# Patient Record
Sex: Female | Born: 1940
Health system: Southern US, Community
[De-identification: ages and names within clinical notes are randomized; demographics above are authoritative.]

## PROBLEM LIST (undated history)

## (undated) DIAGNOSIS — R7303 Prediabetes: Secondary | ICD-10-CM

## (undated) DIAGNOSIS — K5790 Diverticulosis of intestine, part unspecified, without perforation or abscess without bleeding: Secondary | ICD-10-CM

## (undated) DIAGNOSIS — E039 Hypothyroidism, unspecified: Secondary | ICD-10-CM

## (undated) DIAGNOSIS — M810 Age-related osteoporosis without current pathological fracture: Secondary | ICD-10-CM

## (undated) DIAGNOSIS — E119 Type 2 diabetes mellitus without complications: Secondary | ICD-10-CM

## (undated) DIAGNOSIS — M1990 Unspecified osteoarthritis, unspecified site: Secondary | ICD-10-CM

## (undated) DIAGNOSIS — I7300 Raynaud's syndrome without gangrene: Secondary | ICD-10-CM

## (undated) DIAGNOSIS — L940 Localized scleroderma [morphea]: Secondary | ICD-10-CM

## (undated) DIAGNOSIS — B029 Zoster without complications: Secondary | ICD-10-CM

## (undated) DIAGNOSIS — M722 Plantar fascial fibromatosis: Secondary | ICD-10-CM

## (undated) DIAGNOSIS — F419 Anxiety disorder, unspecified: Secondary | ICD-10-CM

## (undated) DIAGNOSIS — M940 Chondrocostal junction syndrome [Tietze]: Secondary | ICD-10-CM

## (undated) DIAGNOSIS — M358 Other specified systemic involvement of connective tissue: Secondary | ICD-10-CM

## (undated) DIAGNOSIS — K743 Primary biliary cirrhosis: Secondary | ICD-10-CM

## (undated) DIAGNOSIS — E079 Disorder of thyroid, unspecified: Secondary | ICD-10-CM

## (undated) DIAGNOSIS — K8020 Calculus of gallbladder without cholecystitis without obstruction: Secondary | ICD-10-CM

## (undated) DIAGNOSIS — I10 Essential (primary) hypertension: Secondary | ICD-10-CM

## (undated) DIAGNOSIS — E7800 Pure hypercholesterolemia, unspecified: Secondary | ICD-10-CM

## (undated) DIAGNOSIS — M8580 Other specified disorders of bone density and structure, unspecified site: Secondary | ICD-10-CM

## (undated) DIAGNOSIS — B009 Herpesviral infection, unspecified: Secondary | ICD-10-CM

## (undated) DIAGNOSIS — C801 Malignant (primary) neoplasm, unspecified: Secondary | ICD-10-CM

## (undated) HISTORY — DX: Pure hypercholesterolemia, unspecified: E78.00

## (undated) HISTORY — DX: Calculus of gallbladder without cholecystitis without obstruction: K80.20

## (undated) HISTORY — DX: Plantar fascial fibromatosis: M72.2

## (undated) HISTORY — DX: Chondrocostal junction syndrome (tietze): M94.0

## (undated) HISTORY — PX: CATARACT EXTRACTION: SUR2

## (undated) HISTORY — DX: Herpesviral infection, unspecified: B00.9

## (undated) HISTORY — DX: Other specified disorders of bone density and structure, unspecified site: M85.80

## (undated) HISTORY — DX: Essential (primary) hypertension: I10

## (undated) HISTORY — DX: Zoster without complications: B02.9

## (undated) HISTORY — DX: Type 2 diabetes mellitus without complications: E11.9

## (undated) HISTORY — DX: Disorder of thyroid, unspecified: E07.9

## (undated) HISTORY — DX: Age-related osteoporosis without current pathological fracture: M81.0

## (undated) HISTORY — DX: Localized scleroderma (morphea): L94.0

## (undated) HISTORY — DX: Other specified systemic involvement of connective tissue: M35.8

## (undated) HISTORY — PX: ABDOMINAL HYSTERECTOMY: SHX81

## (undated) HISTORY — DX: Diverticulosis of intestine, part unspecified, without perforation or abscess without bleeding: K57.90

## (undated) HISTORY — DX: Hypothyroidism, unspecified: E03.9

## (undated) HISTORY — DX: Malignant (primary) neoplasm, unspecified: C80.1

## (undated) HISTORY — DX: Anxiety disorder, unspecified: F41.9

## (undated) HISTORY — PX: HEMORROIDECTOMY: SUR656

## (undated) HISTORY — DX: Primary biliary cirrhosis: K74.3

---

## 1997-08-28 ENCOUNTER — Other Ambulatory Visit: Admission: RE | Admit: 1997-08-28 | Discharge: 1997-08-28 | Payer: Self-pay | Admitting: Family Medicine

## 1997-10-30 ENCOUNTER — Other Ambulatory Visit: Admission: RE | Admit: 1997-10-30 | Discharge: 1997-10-30 | Payer: Self-pay | Admitting: Gynecology

## 1998-10-30 ENCOUNTER — Other Ambulatory Visit: Admission: RE | Admit: 1998-10-30 | Discharge: 1998-10-30 | Payer: Self-pay | Admitting: Gynecology

## 1999-11-01 ENCOUNTER — Other Ambulatory Visit: Admission: RE | Admit: 1999-11-01 | Discharge: 1999-11-01 | Payer: Self-pay | Admitting: Gynecology

## 2000-11-04 ENCOUNTER — Other Ambulatory Visit: Admission: RE | Admit: 2000-11-04 | Discharge: 2000-11-04 | Payer: Self-pay | Admitting: Gynecology

## 2001-11-09 ENCOUNTER — Other Ambulatory Visit: Admission: RE | Admit: 2001-11-09 | Discharge: 2001-11-09 | Payer: Self-pay | Admitting: Gynecology

## 2002-03-09 ENCOUNTER — Encounter: Admission: RE | Admit: 2002-03-09 | Discharge: 2002-03-09 | Payer: Self-pay | Admitting: Family Medicine

## 2002-03-09 ENCOUNTER — Encounter: Payer: Self-pay | Admitting: Family Medicine

## 2002-11-15 ENCOUNTER — Other Ambulatory Visit: Admission: RE | Admit: 2002-11-15 | Discharge: 2002-11-15 | Payer: Self-pay | Admitting: Gynecology

## 2003-11-16 ENCOUNTER — Other Ambulatory Visit: Admission: RE | Admit: 2003-11-16 | Discharge: 2003-11-16 | Payer: Self-pay | Admitting: Gynecology

## 2004-02-26 ENCOUNTER — Encounter: Admission: RE | Admit: 2004-02-26 | Discharge: 2004-04-10 | Payer: Self-pay | Admitting: Neurology

## 2004-06-22 ENCOUNTER — Emergency Department (HOSPITAL_COMMUNITY): Admission: EM | Admit: 2004-06-22 | Discharge: 2004-06-22 | Payer: Self-pay | Admitting: Emergency Medicine

## 2004-07-03 ENCOUNTER — Ambulatory Visit (HOSPITAL_COMMUNITY): Admission: RE | Admit: 2004-07-03 | Discharge: 2004-07-03 | Payer: Self-pay | Admitting: Family Medicine

## 2004-07-03 ENCOUNTER — Ambulatory Visit: Payer: Self-pay | Admitting: Cardiology

## 2004-07-03 ENCOUNTER — Encounter: Payer: Self-pay | Admitting: Cardiology

## 2004-11-26 ENCOUNTER — Other Ambulatory Visit: Admission: RE | Admit: 2004-11-26 | Discharge: 2004-11-26 | Payer: Self-pay | Admitting: Gynecology

## 2005-12-08 ENCOUNTER — Other Ambulatory Visit: Admission: RE | Admit: 2005-12-08 | Discharge: 2005-12-08 | Payer: Self-pay | Admitting: Gynecology

## 2006-05-19 DIAGNOSIS — C801 Malignant (primary) neoplasm, unspecified: Secondary | ICD-10-CM

## 2006-05-19 HISTORY — DX: Malignant (primary) neoplasm, unspecified: C80.1

## 2006-10-07 ENCOUNTER — Encounter: Admission: RE | Admit: 2006-10-07 | Discharge: 2006-10-07 | Payer: Self-pay | Admitting: Family Medicine

## 2006-12-22 ENCOUNTER — Other Ambulatory Visit: Admission: RE | Admit: 2006-12-22 | Discharge: 2006-12-22 | Payer: Self-pay | Admitting: Gynecology

## 2008-03-14 IMAGING — CT CT ABDOMEN W/ CM
2 of 5 series · 17 of 46 positions shown, 19 images · IV contrast (READICAT/WATER & [ID] OMNI 300)
Comparison: None.

ABDOMEN CT WITH CONTRAST:

CLINICAL DATA: Severe abdominal pain
TECHNIQUE: Multidetector CT imaging of the abdomen and pelvis was performed
following the standard protocol during bolus administration of intravenous
contrast.

Contrast:  100 cc Omnipaque 300

[Series 2: abdomen w/ · axial · 0.70mm/px · z∈[-375,-25]mm · 14 of 80 slices shown, 16 images]
[im 5/80  soft-tissue]
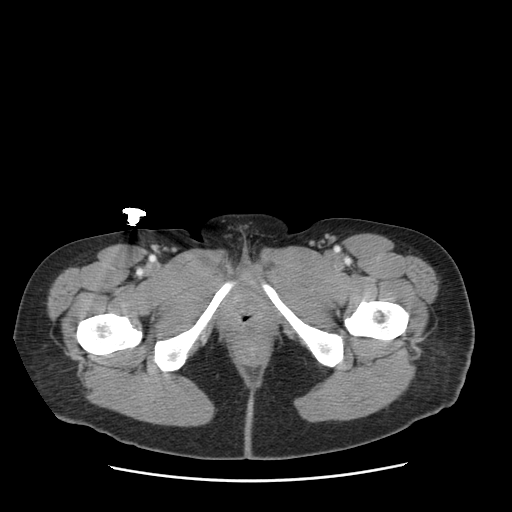
[im 5/80  bone]
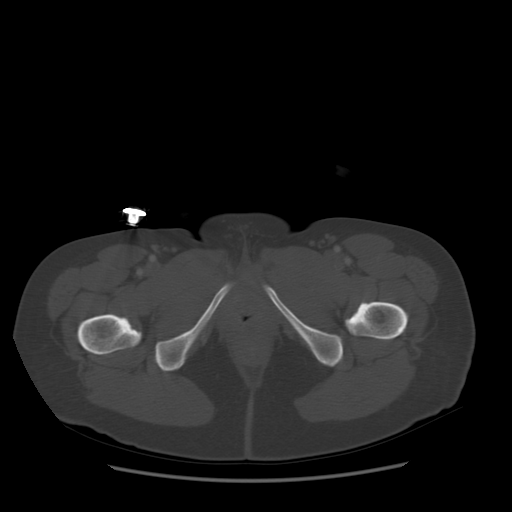
[im 9/80  soft-tissue]
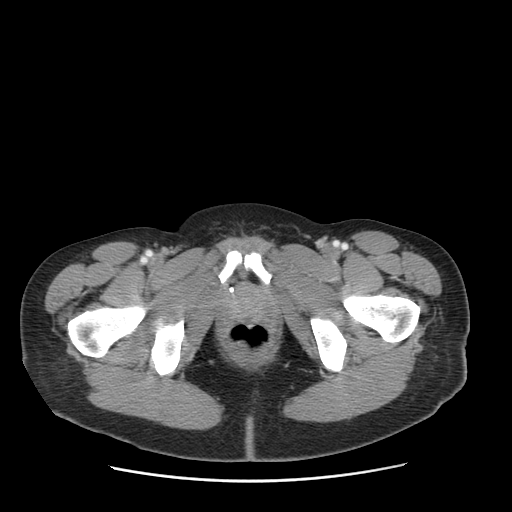
[im 17/80  soft-tissue]
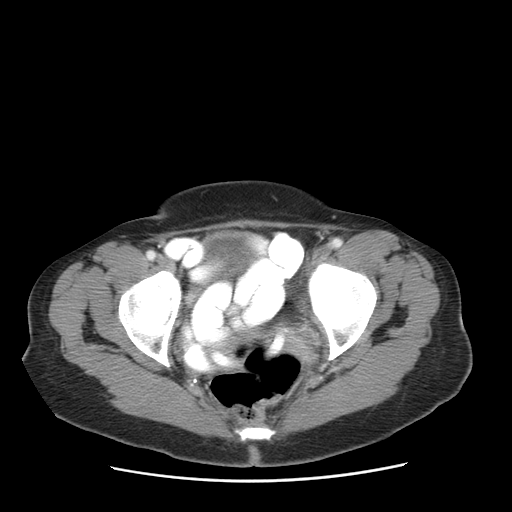
[im 21/80  soft-tissue]
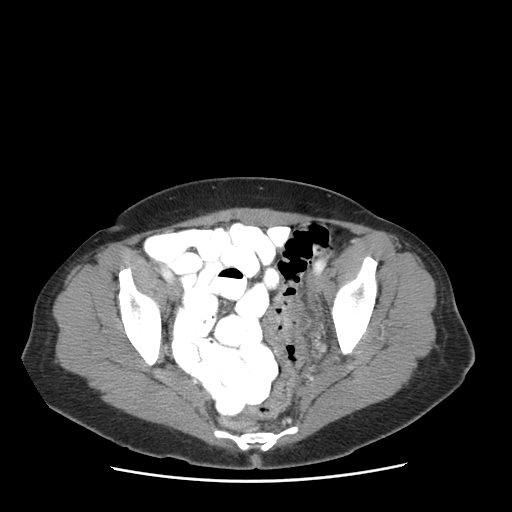
[im 25/80  soft-tissue]
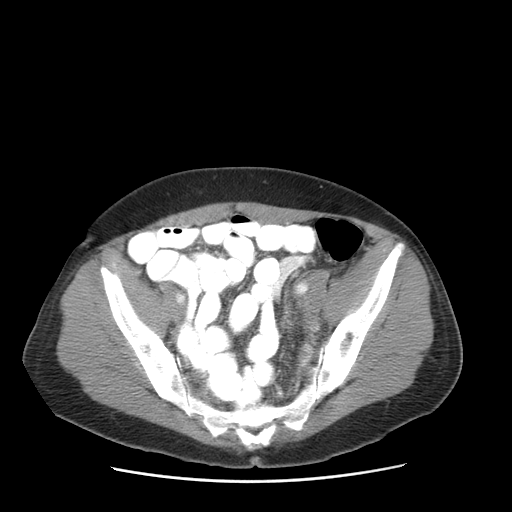
[im 34/80  soft-tissue]
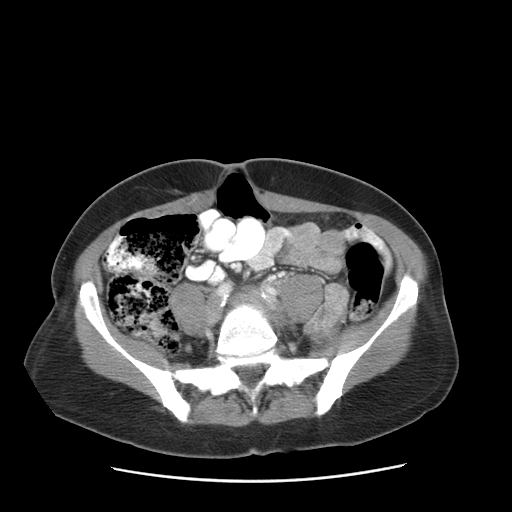
[im 38/80  soft-tissue]
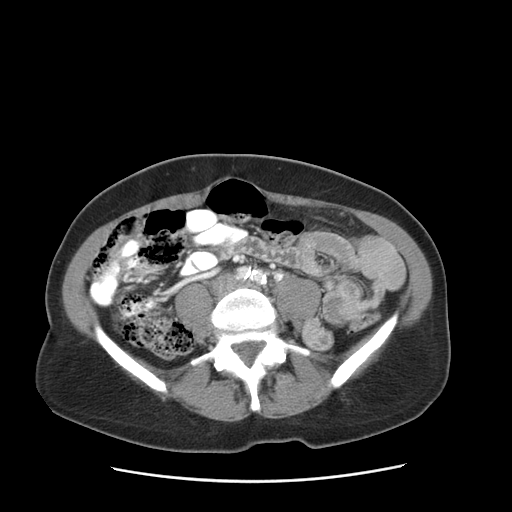
[im 42/80  soft-tissue]
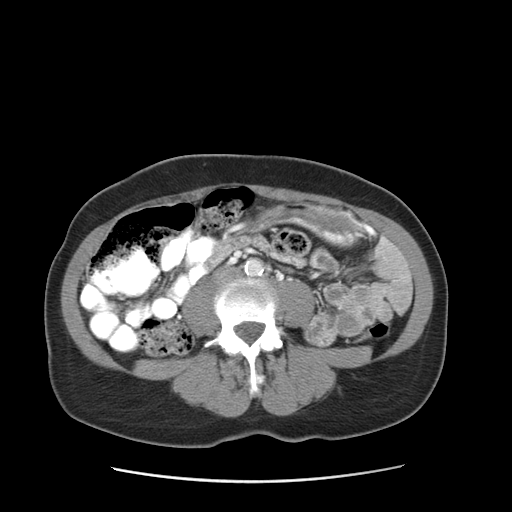
[im 46/80  soft-tissue]
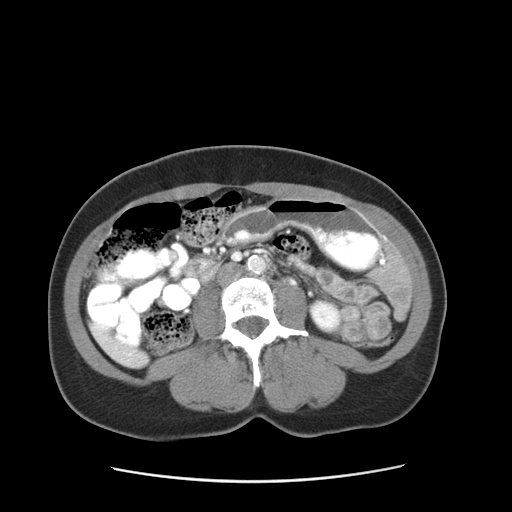
[im 46/80  bone]
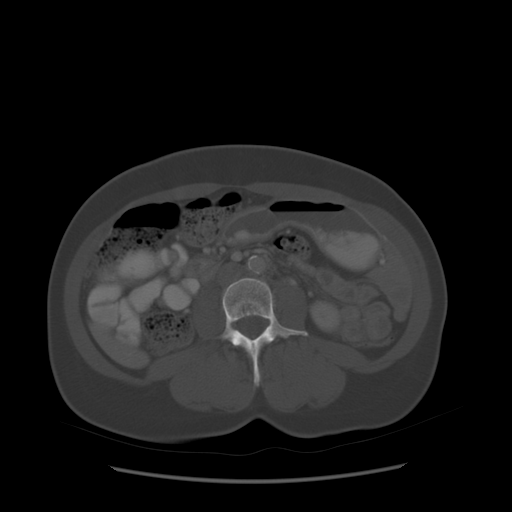
[im 55/80  soft-tissue]
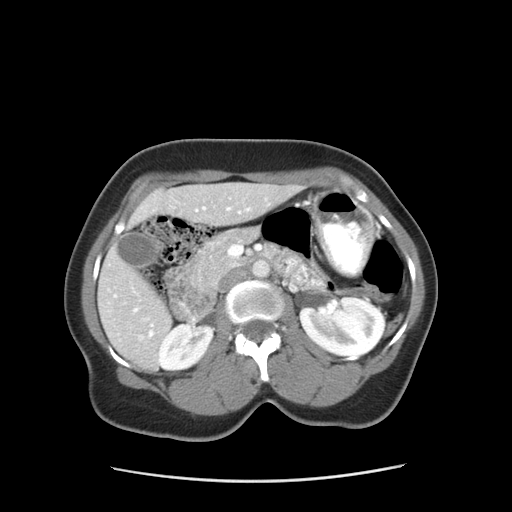
[im 59/80  soft-tissue]
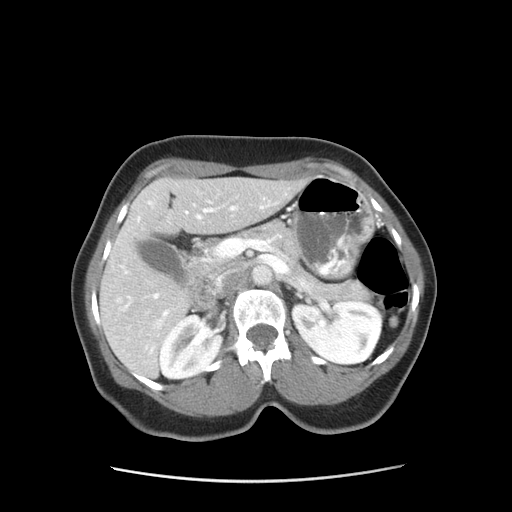
[im 63/80  soft-tissue]
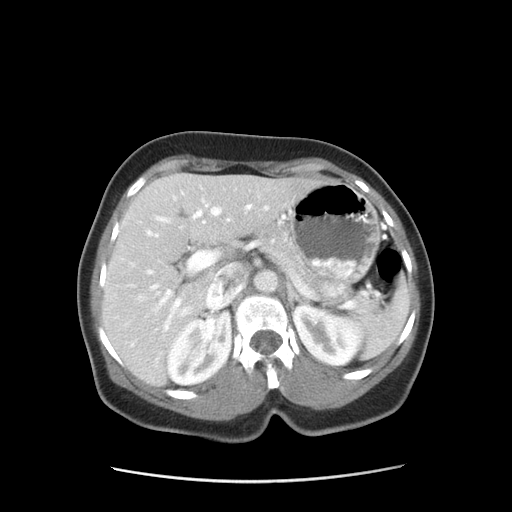
[im 71/80  soft-tissue]
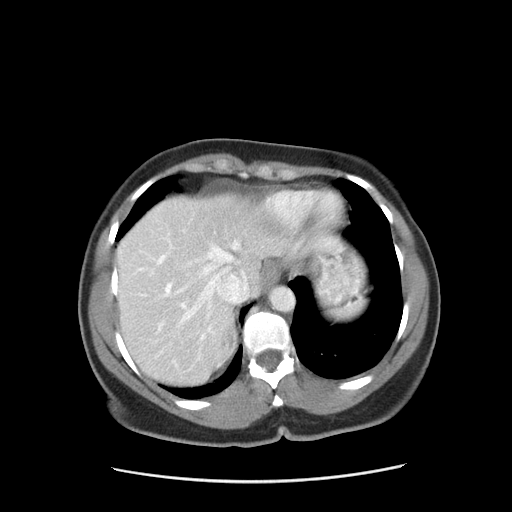
[im 75/80  soft-tissue]
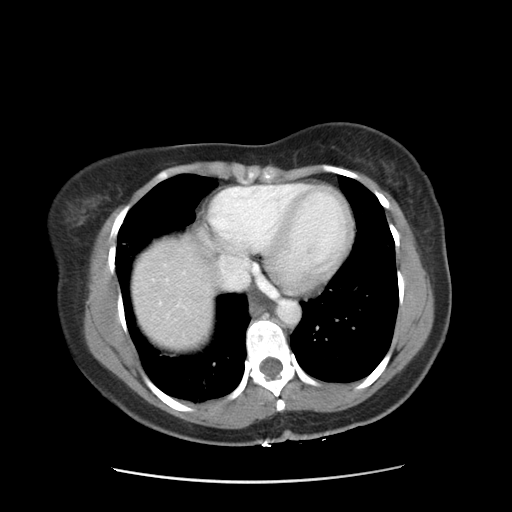

[Series 401: cor · coronal · 0.79mm/px · 3 of 85 slices shown]
[im 29/85  soft-tissue]
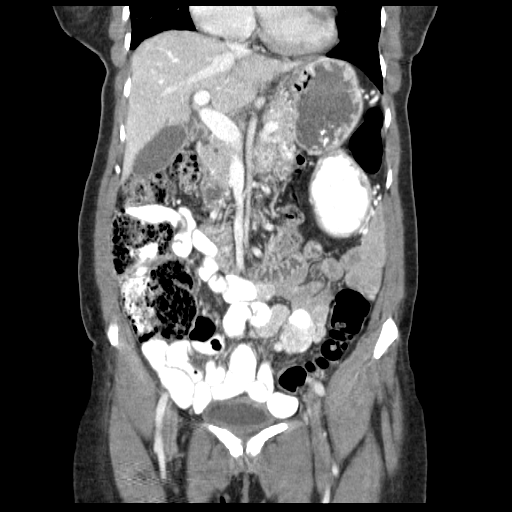
[im 38/85  soft-tissue]
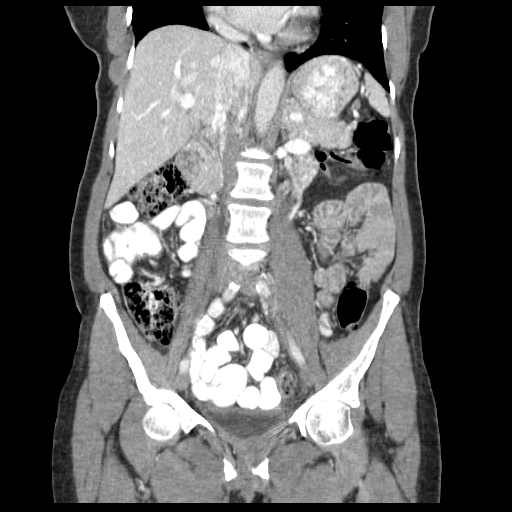
[im 47/85  soft-tissue]
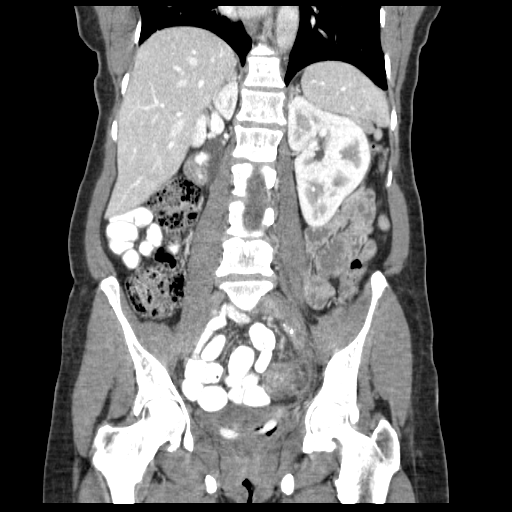

[17 of 46 positions shown; findings below may reference images not displayed]

FINDINGS: The liver, spleen, stomach, duodenum, pancreas, gallbladder, adrenal glands, and
right kidney have normal imaging features. Tiny low-density subcapsular lesion
in the left kidney is probably a tiny cyst. There is no abdominal
lymphadenopathy. Atherosclerotic calcification is seen in the aorta without
aneurysm. No intraperitoneal free fluid. Abdominal bowel loops are unremarkable.
IMPRESSION: No acute findings in the abdomen.

PELVIS CT WITH CONTRAST:
FINDINGS: No axillary lymphadenopathy. No intraperitoneal free fluid. Patient is status
post has hysterectomy. A pessary or E-ring is noted in the vagina. 

There is some wall thickening in the sigmoid colon with background left colonic
diverticulosis. The coronal and sagittal reformation is better demonstrate the
edema/inflammation around the sigmoid colon and do the axial images. 

No adnexal mass. Terminal ileum is unremarkable. The appendix is not visualized.

Bone windows are unremarkable.
IMPRESSION: Wall thickening with edema and pericolonic inflammation associated with the
sigmoid colon. Given the background diverticular change, this is most likely
related to diverticulitis without perforation or abscess. As neoplasm can
present with similar imaging features, followup evaluation is recommended.

## 2008-04-15 ENCOUNTER — Emergency Department (HOSPITAL_COMMUNITY): Admission: EM | Admit: 2008-04-15 | Discharge: 2008-04-15 | Payer: Self-pay | Admitting: Emergency Medicine

## 2011-01-14 ENCOUNTER — Ambulatory Visit: Payer: Self-pay | Admitting: Dietician

## 2012-05-19 HISTORY — PX: HEMORRHOID SURGERY: SHX153

## 2012-12-16 HISTORY — PX: COLONOSCOPY: SHX174

## 2013-04-05 ENCOUNTER — Ambulatory Visit (INDEPENDENT_AMBULATORY_CARE_PROVIDER_SITE_OTHER): Payer: Medicare PPO | Admitting: Gynecology

## 2013-04-05 ENCOUNTER — Encounter: Payer: Self-pay | Admitting: Gynecology

## 2013-04-05 ENCOUNTER — Other Ambulatory Visit (HOSPITAL_COMMUNITY)
Admission: RE | Admit: 2013-04-05 | Discharge: 2013-04-05 | Disposition: A | Discharge status: Home / Self Care | Payer: Medicare PPO | Source: Ambulatory Visit | Attending: Gynecology | Admitting: Gynecology

## 2013-04-05 VITALS — BP 132/88 | Ht 61.0 in | Wt 111.0 lb

## 2013-04-05 DIAGNOSIS — Z1151 Encounter for screening for human papillomavirus (HPV): Secondary | ICD-10-CM | POA: Insufficient documentation

## 2013-04-05 DIAGNOSIS — Z1272 Encounter for screening for malignant neoplasm of vagina: Secondary | ICD-10-CM

## 2013-04-05 DIAGNOSIS — M8580 Other specified disorders of bone density and structure, unspecified site: Secondary | ICD-10-CM

## 2013-04-05 DIAGNOSIS — M899 Disorder of bone, unspecified: Secondary | ICD-10-CM

## 2013-04-05 DIAGNOSIS — Z124 Encounter for screening for malignant neoplasm of cervix: Secondary | ICD-10-CM | POA: Insufficient documentation

## 2013-04-05 DIAGNOSIS — N952 Postmenopausal atrophic vaginitis: Secondary | ICD-10-CM | POA: Insufficient documentation

## 2013-04-05 DIAGNOSIS — D071 Carcinoma in situ of vulva: Secondary | ICD-10-CM | POA: Insufficient documentation

## 2013-04-05 MED ORDER — ESTROGENS, CONJUGATED 0.625 MG/GM VA CREA: TOPICAL_CREAM | VAGINAL | Status: DC

## 2013-04-05 NOTE — Progress Notes (Signed)
Linda Barber Mar 11, 1941 [id number]   History:    72 y.o. who is a new patient to our practice and was being previously followed by Dr. Nicholas Lose. Patient brought some records with her and which it was noted that she has a past history of of a total abdominal hysterectomy with bilateral salpingo-oophorectomy in 1995 for uterine leiomyoma. Patient also has a history of vulvar carcinoma in situ excised in 2008 with no residual or evidence of recurrence reported. Patient's primary care physician is Dr. Mirna Mires who has been doing patient's lab work. Patient had a colonoscopy with Dr. Susy Frizzle often 2014 and this year also she had a hemorrhoidectomy. Patient's last mammogram was in July of this year. Patient denies any prior history of abnormal Pap smears. Her last bone density study was in 2012 and which in total hip demonstrated the lowest T score as being -1.3. Patient states all her immunizations are up to date. Patient is currently on Premarin vaginal cream twice a week for vaginal atrophy.  Past medical history,surgical history, family history and social history were all reviewed and documented in the EPIC chart.  Gynecologic History No LMP recorded. Patient has had a hysterectomy. Contraception: post menopausal status Last Pap: 2013?Marland Kitchen Results were: normal Last mammogram: 2014. Results were: normal  Obstetric History OB History  Gravida Para Term Preterm AB SAB TAB Ectopic Multiple Living  2 2        2     # Outcome Date GA Lbr Len/2nd Weight Sex Delivery Anes PTL Lv  2 PAR           1 PAR                ROS: A ROS was performed and pertinent positives and negatives are included in the history.  GENERAL: No fevers or chills. HEENT: No change in vision, no earache, sore throat or sinus congestion. NECK: No pain or stiffness. CARDIOVASCULAR: No chest pain or pressure. No palpitations. PULMONARY: No shortness of breath, cough or wheeze. GASTROINTESTINAL: No abdominal pain, nausea, vomiting or  diarrhea, melena or bright red blood per rectum. GENITOURINARY: No urinary frequency, urgency, hesitancy or dysuria. MUSCULOSKELETAL: No joint or muscle pain, no back pain, no recent trauma. DERMATOLOGIC: No rash, no itching, no lesions. ENDOCRINE: No polyuria, polydipsia, no heat or cold intolerance. No recent change in weight. HEMATOLOGICAL: No anemia or easy bruising or bleeding. NEUROLOGIC: No headache, seizures, numbness, tingling or weakness. PSYCHIATRIC: No depression, no loss of interest in normal activity or change in sleep pattern.     Exam: chaperone present  BP 132/88  Ht 5\' 1"  (1.549 m)  Wt 111 lb (50.349 kg)  BMI 20.98 kg/m2  Body mass index is 20.98 kg/(m^2).  General appearance : Well developed well nourished female. No acute distress HEENT: Neck supple, trachea midline, no carotid bruits, no thyroidmegaly Lungs: Clear to auscultation, no rhonchi or wheezes, or rib retractions  Heart: Regular rate and rhythm, no murmurs or gallops Breast:Examined in sitting and supine position were symmetrical in appearance, no palpable masses or tenderness,  no skin retraction, no nipple inversion, no nipple discharge, no skin discoloration, no axillary or supraclavicular lymphadenopathy Abdomen: no palpable masses or tenderness, no rebound or guarding Extremities: no edema or skin discoloration or tenderness  Pelvic:  Bartholin, Urethra, Skene Glands: Within normal limits             Vagina: No gross lesions or discharge  Cervix: absent  Uterus  Absence  Adnexa  Without masses  or tenderness  Anus and perineum  normal   Rectovaginal  normal sphincter tone without palpated masses or tenderness             Hemoccult Colonoscopy and hemorrhoidectomy this year     Assessment/Plan:  72 y.o. female with past history in 2008 04 for carcinoma in situ completely excised with no residual. No abnormality noted today on visual inspection of the external genitalia. Because of patient's history of  vulvar carcinoma in situ a Pap smear was done today. PCP will be drawn patient blood work. Patient is not currently taking any calcium because her calcium level recently was in the upper limits of normal. She is taking vitamin D 2000 units daily and has an active lifestyle. She will schedule a bone density study here in our office in the next few weeks. Prescription refill for Premarin to apply vaginally twice a week was provided.  Note: This dictation was prepared with  Dragon/digital dictation along withSmart phrase technology. Any transcriptional errors that result from this process are unintentional.   Ok Edwards MD, 9:50 AM 04/05/2013

## 2013-04-05 NOTE — Patient Instructions (Signed)
Bone Densitometry Bone densitometry is a special X-ray that measures your bone density and can be used to help predict your risk of bone fractures. This test is used to determine bone mineral content and density to diagnose osteoporosis. Osteoporosis is the loss of bone that may cause the bone to become weak. Osteoporosis commonly occurs in women entering menopause. However, it may be found in men and in people with other diseases. PREPARATION FOR TEST No preparation necessary. WHO SHOULD BE TESTED?  All women older than 65.  Postmenopausal women (50 to 65) with risk factors for osteoporosis.  People with a previous fracture caused by normal activities.  People with a small body frame (less than 127 poundsor a body mass index [BMI] of less than 21).  People who have a parent with a hip fracture or history of osteoporosis.  People who smoke.  People who have rheumatoid arthritis.  Anyone who engages in excessive alcohol use (more than 3 drinks most days).  Women who experience early menopause. WHEN SHOULD YOU BE RETESTED? Current guidelines suggest that you should wait at least 2 years before doing a bone density test again if your first test was normal.Recent studies indicated that women with normal bone density may be able to wait a few years before needing to repeat a bone density test. You should discuss this with your caregiver.  NORMAL FINDINGS   Normal: less than standard deviation below normal (greater than -1).  Osteopenia: 1 to 2.5 standard deviations below normal (-1 to -2.5).  Osteoporosis: greater than 2.5 standard deviations below normal (less than -2.5). Test results are reported as a "T score" and a "Z score."The T score is a number that compares your bone density with the bone density of healthy, young women.The Z score is a number that compares your bone density with the scores of women who are the same age, gender, and race.  Ranges for normal findings may vary  among different laboratories and hospitals. You should always check with your doctor after having lab work or other tests done to discuss the meaning of your test results and whether your values are considered within normal limits. MEANING OF TEST  Your caregiver will go over the test results with you and discuss the importance and meaning of your results, as well as treatment options and the need for additional tests if necessary. OBTAINING THE TEST RESULTS It is your responsibility to obtain your test results. Ask the lab or department performing the test when and how you will get your results. Document Released: 05/27/2004 Document Revised: 07/28/2011 Document Reviewed: 06/19/2010 [employer] Patient Information 2014 [employer].  

## 2013-04-06 ENCOUNTER — Telehealth: Payer: Self-pay | Admitting: *Deleted

## 2013-04-06 MED ORDER — ESTROGENS, CONJUGATED 0.625 MG/GM VA CREA: TOPICAL_CREAM | VAGINAL | Status: DC

## 2013-04-06 NOTE — Telephone Encounter (Signed)
Pt called stating rx for premarin vaginal cream on OV 04/05/13. rx sent.

## 2013-06-07 ENCOUNTER — Encounter: Payer: Self-pay | Admitting: Anesthesiology

## 2013-06-07 ENCOUNTER — Ambulatory Visit (INDEPENDENT_AMBULATORY_CARE_PROVIDER_SITE_OTHER): Payer: Medicare PPO

## 2013-06-07 DIAGNOSIS — M949 Disorder of cartilage, unspecified: Secondary | ICD-10-CM

## 2013-06-07 DIAGNOSIS — M8580 Other specified disorders of bone density and structure, unspecified site: Secondary | ICD-10-CM

## 2013-06-07 DIAGNOSIS — M899 Disorder of bone, unspecified: Secondary | ICD-10-CM

## 2013-11-25 ENCOUNTER — Encounter: Payer: Self-pay | Admitting: Gynecology

## 2014-03-20 ENCOUNTER — Encounter: Payer: Self-pay | Admitting: Gynecology

## 2014-03-22 ENCOUNTER — Other Ambulatory Visit: Payer: Self-pay | Admitting: Gastroenterology

## 2014-03-22 DIAGNOSIS — R109 Unspecified abdominal pain: Secondary | ICD-10-CM

## 2014-03-22 DIAGNOSIS — R634 Abnormal weight loss: Secondary | ICD-10-CM

## 2014-03-30 ENCOUNTER — Encounter (INDEPENDENT_AMBULATORY_CARE_PROVIDER_SITE_OTHER): Payer: Self-pay

## 2014-03-30 ENCOUNTER — Ambulatory Visit
Admission: RE | Admit: 2014-03-30 | Discharge: 2014-03-30 | Disposition: A | Discharge status: Home / Self Care | Payer: Medicare PPO | Source: Ambulatory Visit | Attending: Gastroenterology | Admitting: Gastroenterology

## 2014-03-30 DIAGNOSIS — R634 Abnormal weight loss: Secondary | ICD-10-CM

## 2014-03-30 DIAGNOSIS — R109 Unspecified abdominal pain: Secondary | ICD-10-CM

## 2014-04-04 ENCOUNTER — Ambulatory Visit: Payer: Medicare PPO | Admitting: Gynecology

## 2014-04-04 ENCOUNTER — Other Ambulatory Visit (INDEPENDENT_AMBULATORY_CARE_PROVIDER_SITE_OTHER): Payer: Self-pay | Admitting: General Surgery

## 2014-04-04 NOTE — H&P (Signed)
Linda Barber 04/04/2014 12:06 PM Location: [facility] Patient #: [id number] DOB: March 04, 1941 Widowed / Language: Cleophus Molt / Race: Black or African American Female  History of Present Illness [name]. [name] MD; 04/04/2014 12:28 PM) Patient words: eval for gallstones.  The patient is a 73 year old female who presents with abdominal pain. We are asked to see the patient in consultation by Dr. Earlean Shawl to evaluate her for gallstones. The patient is a 73 year old black female who is been experiencing abdominal pain for the last year and a half. The pain occurs about every 2-3 months. The pain she has been having has been mostly lower and central abdominal pain. The pain is been associated with nausea and vomiting. She has changed some of her medications and this is had no effect on her pain and nausea. She recently underwent an ultrasound which did show stones in the gallbladder but no gallbladder wall thickening or ductal dilatation.   Other Problems Joseph Pierini, LPN; 32/20/2542 70:62 PM) Cancer Cholelithiasis Diabetes Mellitus Diverticulosis Hemorrhoids High blood pressure Hypercholesterolemia  Past Surgical History Joseph Pierini, LPN; 37/62/8315 17:61 PM) Cataract Surgery Bilateral. Hemorrhoidectomy Hysterectomy (not due to cancer) - Complete  Diagnostic Studies History Joseph Pierini, LPN; 60/73/7106 26:94 PM) Colonoscopy within last year Mammogram within last year Pap Smear 1-5 years ago  Allergies Joseph Pierini, LPN; 85/46/2703 50:09 PM) Codeine Phosphate *ANALGESICS - OPIOID*  Medication History Joseph Pierini, LPN; 38/18/2993 71:69 PM) ClonazePAM (Oral) Specific dose unknown - Active. Accu-Chek Aviva Plus (In Vitro) Specific dose unknown - Active. Carvedilol (Oral) Specific dose unknown - Active. Hyoscyamine Sulfate (Oral) Specific dose unknown - Active. Losartan Potassium (Oral) Specific dose unknown - Active. MetroNIDAZOLE (Oral)  Specific dose unknown - Active. Niacin ER (Antihyperlipidemic) (Oral) Specific dose unknown - Active. Premarin (Vaginal) Specific dose unknown - Active. Simvastatin (Oral) Specific dose unknown - Active. Thiothixene (Oral) Specific dose unknown - Active. Zocor (Oral) Specific dose unknown - Active. CoQ-10 & Fish Oil (120-180-50-30 Capsule, Oral) Active. Vitamin D (Oral) Specific dose unknown - Active. Multivitamin Adults 50+ (Oral) Specific dose unknown - Active. Aspirin EC (Oral) Specific dose unknown - Active.  Social History Jenny Reichmann [place], LPN; 67/89/3810 17:51 PM) Caffeine use Carbonated beverages, Coffee, Tea. No alcohol use No drug use Tobacco use Never smoker.  Family History Joseph Pierini, LPN; 02/58/5277 82:42 PM) Cerebrovascular Accident Mother. Heart Disease Mother. Hypertension Brother, Mother. Kidney Disease Brother.  Pregnancy / Birth History Joseph Pierini, LPN; 35/36/1443 15:40 PM) Age at menarche 58 years. Age of menopause 83-55 Gravida 2 Maternal age 45-30 Para 2  Review of Systems Jenny Reichmann [place] LPN; 08/67/6195 09:32 PM) General Present- Weight Loss. Not Present- Appetite Loss, Chills, Fatigue, Fever, Night Sweats and Weight Gain. Skin Not Present- Change in Wart/Mole, Dryness, Hives, Jaundice, New Lesions, Non-Healing Wounds, Rash and Ulcer. HEENT Present- Seasonal Allergies and Wears glasses/contact lenses. Not Present- Earache, Hearing Loss, Hoarseness, Nose Bleed, Oral Ulcers, Ringing in the Ears, Sinus Pain, Sore Throat, Visual Disturbances and Yellow Eyes. Respiratory Not Present- Bloody sputum, Chronic Cough, Difficulty Breathing, Snoring and Wheezing. Breast Not Present- Breast Mass, Breast Pain, Nipple Discharge and Skin Changes. Cardiovascular Present- Rapid Heart Rate. Not Present- Chest Pain, Difficulty Breathing Lying Down, Leg Cramps, Palpitations, Shortness of Breath and Swelling of Extremities. Gastrointestinal Present-  Abdominal Pain and Vomiting. Not Present- Bloating, Bloody Stool, Change in Bowel Habits, Chronic diarrhea, Constipation, Difficulty Swallowing, Excessive gas, Gets full quickly at meals, Hemorrhoids, Indigestion, Nausea and Rectal Pain. Female Genitourinary Present- Frequency. Not Present- Nocturia,  Painful Urination, Pelvic Pain and Urgency. Musculoskeletal Not Present- Back Pain, Joint Pain, Joint Stiffness, Muscle Pain, Muscle Weakness and Swelling of Extremities. Neurological Not Present- Decreased Memory, Fainting, Headaches, Numbness, Seizures, Tingling, Tremor, Trouble walking and Weakness. Psychiatric Not Present- Anxiety, Bipolar, Change in Sleep Pattern, Depression, Fearful and Frequent crying. Endocrine Present- New Diabetes. Not Present- Cold Intolerance, Excessive Hunger, Hair Changes, Heat Intolerance and Hot flashes. Hematology Not Present- Easy Bruising, Excessive bleeding, Gland problems, HIV and Persistent Infections.   Vitals Jenny Reichmann Smithey LPN; 46/96/2952 84:13 PM) 04/04/2014 12:13 PM Weight: 109 lb Height: 61in Body Surface Area: 1.46 m Body Mass Index: 20.6 kg/m Temp.: 5F(Oral)  Pulse: 70 (Regular)  Resp.: 18 (Unlabored)  BP: 134/82 (Sitting, Left Arm, Standard)    Physical Exam Eddie Dibbles S. [name] MD; 04/04/2014 12:28 PM) General Mental Status-Alert. General Appearance-Consistent with stated age. Hydration-Well hydrated. Voice-Normal.  Head and Neck Head-normocephalic, atraumatic with no lesions or palpable masses. Trachea-midline. Thyroid Gland Characteristics - normal size and consistency.  Eye Eyeball - Bilateral-Extraocular movements intact. Sclera/Conjunctiva - Bilateral-No scleral icterus.  Chest and Lung Exam Chest and lung exam reveals -quiet, even and easy respiratory effort with no use of accessory muscles and on auscultation, normal breath sounds, no adventitious sounds and normal vocal resonance. Inspection Chest  Wall - Normal. Back - normal.  Cardiovascular Cardiovascular examination reveals -normal heart sounds, regular rate and rhythm with no murmurs and normal pedal pulses bilaterally.  Abdomen Note: The abdomen is soft and nontender. There is no palpable mass. There is a well-healed lower midline scar.   Neurologic Neurologic evaluation reveals -alert and oriented x 3 with no impairment of recent or remote memory. Mental Status-Normal.  Musculoskeletal Normal Exam - Left-Upper Extremity Strength Normal and Lower Extremity Strength Normal. Normal Exam - Right-Upper Extremity Strength Normal and Lower Extremity Strength Normal.  Lymphatic Head & Neck  General Head & Neck Lymphatics: Bilateral - Description - Normal. Axillary  General Axillary Region: Bilateral - Description - Normal. Tenderness - Non Tender. Femoral & Inguinal  Generalized Femoral & Inguinal Lymphatics: Bilateral - Description - Normal. Tenderness - Non Tender.    Assessment & Plan Eddie Dibbles S. [name] MD; 04/04/2014 12:27 PM) Cholelithiasis Impression: The patient appears to have symptomatic cholelithiasis. Because of the risk of further painful episodes and possible pancreatitis think she would benefit from having her gallbladder removed. She would also like to have this done. I have discussed with her in detail the risks and benefits of the operation to remove the gallbladder as well as some of the technical aspects and she understands and wishes to proceed     Signed by Luella Cook, MD (04/04/2014 12:29 PM)

## 2014-04-25 ENCOUNTER — Other Ambulatory Visit (HOSPITAL_COMMUNITY)
Admission: RE | Admit: 2014-04-25 | Discharge: 2014-04-25 | Disposition: A | Discharge status: Home / Self Care | Payer: Medicare PPO | Source: Ambulatory Visit | Attending: Gynecology | Admitting: Gynecology

## 2014-04-25 ENCOUNTER — Ambulatory Visit (INDEPENDENT_AMBULATORY_CARE_PROVIDER_SITE_OTHER): Payer: Medicare PPO | Admitting: Gynecology

## 2014-04-25 ENCOUNTER — Encounter: Payer: Self-pay | Admitting: Gynecology

## 2014-04-25 ENCOUNTER — Encounter: Payer: Medicare PPO | Admitting: Gynecology

## 2014-04-25 VITALS — BP 134/70 | Ht 60.75 in | Wt 106.0 lb

## 2014-04-25 DIAGNOSIS — M8580 Other specified disorders of bone density and structure, unspecified site: Secondary | ICD-10-CM

## 2014-04-25 DIAGNOSIS — Z124 Encounter for screening for malignant neoplasm of cervix: Secondary | ICD-10-CM | POA: Diagnosis present

## 2014-04-25 DIAGNOSIS — D071 Carcinoma in situ of vulva: Secondary | ICD-10-CM

## 2014-04-25 DIAGNOSIS — Z1272 Encounter for screening for malignant neoplasm of vagina: Secondary | ICD-10-CM

## 2014-04-25 DIAGNOSIS — N952 Postmenopausal atrophic vaginitis: Secondary | ICD-10-CM

## 2014-04-25 MED ORDER — NONFORMULARY OR COMPOUNDED ITEM: Status: DC

## 2014-04-25 NOTE — Addendum Note (Signed)
Addended by: Thurnell Garbe A on: 04/25/2014 12:02 PM   Modules accepted: Orders

## 2014-04-25 NOTE — Progress Notes (Signed)
Linda Barber 24-Jun-1940 [id number]   History:    73 y.o.  for GYN exam. Patient also concerned about these lesion that she noted on her right buttock which comes and goes. Patient was seen last year for the first time as a new patient. Her previous provider Dr. Ubaldo Glassing had retired. She had brought some records with her and which it was noted that she has a past history of of a total abdominal hysterectomy with bilateral salpingo-oophorectomy in 1995 for uterine leiomyoma. Patient also has a history of vulvar carcinoma in situ excised in 2008 with no residual or evidence of recurrence reported. Patient's primary care physician is Dr. Iona Beard who has been doing patient's lab work. Patient had a colonoscopy in 2014 which was reported to be normal. Patient also has had a history of hemorrhoidectomy. Patient stated that many years ago she had been on Actonel for a few months. Patient had a bone density study done here in our office in January of this year and although a different manufacturer was utilized by her previous provider where she had her other scan on the T scores were compared. Her lowest T score was -1.1 at the right femoral neck and she had a normal Frax analysis. Her AP T score was -2.2. We compare with 2012 no significant change based on T score only. Patient with no past history of any abnormal Pap smears. Patient states that her flu vaccine as well as shingles vaccine are up-to-date.  Past medical history,surgical history, family history and social history were all reviewed and documented in the EPIC chart.  Gynecologic History No LMP recorded. Patient has had a hysterectomy. Contraception: post menopausal status Last Pap: 2014. Results were: normal Last mammogram: 2015. Results were: normal  Obstetric History OB History  Gravida Para Term Preterm AB SAB TAB Ectopic Multiple Living  2 2        2     # Outcome Date GA Lbr Len/2nd Weight Sex Delivery Anes PTL Lv  2 Para           1  Para                ROS: A ROS was performed and pertinent positives and negatives are included in the history.  GENERAL: No fevers or chills. HEENT: No change in vision, no earache, sore throat or sinus congestion. NECK: No pain or stiffness. CARDIOVASCULAR: No chest pain or pressure. No palpitations. PULMONARY: No shortness of breath, cough or wheeze. GASTROINTESTINAL: No abdominal pain, nausea, vomiting or diarrhea, melena or bright red blood per rectum. GENITOURINARY: No urinary frequency, urgency, hesitancy or dysuria. MUSCULOSKELETAL: No joint or muscle pain, no back pain, no recent trauma. DERMATOLOGIC: No rash, no itching, no lesions. ENDOCRINE: No polyuria, polydipsia, no heat or cold intolerance. No recent change in weight. HEMATOLOGICAL: No anemia or easy bruising or bleeding. NEUROLOGIC: No headache, seizures, numbness, tingling or weakness. PSYCHIATRIC: No depression, no loss of interest in normal activity or change in sleep pattern.     Exam: chaperone present  BP 134/70 mmHg  Ht 5' 0.75" (1.543 m)  Wt 106 lb (48.081 kg)  BMI 20.19 kg/m2  Body mass index is 20.19 kg/(m^2).  General appearance : Well developed well nourished female. No acute distress HEENT: Neck supple, trachea midline, no carotid bruits, no thyroidmegaly Lungs: Clear to auscultation, no rhonchi or wheezes, or rib retractions  Heart: Regular rate and rhythm, no murmurs or gallops Breast:Examined in sitting and supine position were  symmetrical in appearance, no palpable masses or tenderness,  no skin retraction, no nipple inversion, no nipple discharge, no skin discoloration, no axillary or supraclavicular lymphadenopathy Abdomen: no palpable masses or tenderness, no rebound or guarding Extremities: no edema or skin discoloration or tenderness  Pelvic:  Bartholin, Urethra, Skene Glands: Within normal limits             Vagina: No gross lesions or discharge, vaginal atrophy  Cervix: Absent  Uterus  absent  Adnexa  Without masses or tenderness  Anus and perineum  normal   Rectovaginal  normal sphincter tone without palpated masses or tenderness             Hemoccult PCP provides   Left buttock's scabs noted which patient had marked with a marking pen for me to see  Assessment/Plan:  73 y.o. 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 Patient with annual Pap smears of been normal. Patient with vaginal atrophy doing well with vaginal estrogen twice a week. Patient will return back to the office later in the week for biopsy of the right buttock's area which appears to be scabs. She states this comes and goes and denies any history in the past of any sexually transmitted diseases. Her PCP has been doing her blood work which she brought copies of which will be scanned in place and her records. She was reminded of the importance of calcium vitamin D and regular exercise for osteoporosis prevention we discussed importance of monthly breast exam. Pap smear was done today.   Terrance Mass MD, 11:36 AM 04/25/2014

## 2014-04-26 ENCOUNTER — Encounter (HOSPITAL_COMMUNITY)
Admission: RE | Admit: 2014-04-26 | Discharge: 2014-04-26 | Disposition: A | Discharge status: Home / Self Care | Payer: Medicare PPO | Source: Ambulatory Visit | Attending: General Surgery | Admitting: General Surgery

## 2014-04-26 ENCOUNTER — Encounter (HOSPITAL_COMMUNITY): Payer: Self-pay

## 2014-04-26 DIAGNOSIS — Z01818 Encounter for other preprocedural examination: Secondary | ICD-10-CM | POA: Insufficient documentation

## 2014-04-26 LAB — CBC WITH DIFFERENTIAL/PLATELET
BASOS PCT: 0 % (ref 0–1)
Basophils Absolute: 0 10*3/uL (ref 0.0–0.1)
EOS PCT: 1 % (ref 0–5)
Eosinophils Absolute: 0.1 10*3/uL (ref 0.0–0.7)
HEMATOCRIT: 41.5 % (ref 36.0–46.0)
HEMOGLOBIN: 13.3 g/dL (ref 12.0–15.0)
LYMPHS ABS: 2.3 10*3/uL (ref 0.7–4.0)
Lymphocytes Relative: 46 % (ref 12–46)
MCH: 27.1 pg (ref 26.0–34.0)
MCHC: 32 g/dL (ref 30.0–36.0)
MCV: 84.7 fL (ref 78.0–100.0)
MONO ABS: 0.5 10*3/uL (ref 0.1–1.0)
Monocytes Relative: 11 % (ref 3–12)
Neutro Abs: 2.2 10*3/uL (ref 1.7–7.7)
Neutrophils Relative %: 42 % — ABNORMAL LOW (ref 43–77)
Platelets: 235 10*3/uL (ref 150–400)
RBC: 4.9 MIL/uL (ref 3.87–5.11)
RDW: 13.2 % (ref 11.5–15.5)
WBC: 5.1 10*3/uL (ref 4.0–10.5)

## 2014-04-26 LAB — COMPREHENSIVE METABOLIC PANEL
ALBUMIN: 3.7 g/dL (ref 3.5–5.2)
ALT: 21 U/L (ref 0–35)
AST: 27 U/L (ref 0–37)
Alkaline Phosphatase: 64 U/L (ref 39–117)
Anion gap: 13 (ref 5–15)
BILIRUBIN TOTAL: 0.3 mg/dL (ref 0.3–1.2)
BUN: 15 mg/dL (ref 6–23)
CHLORIDE: 100 meq/L (ref 96–112)
CO2: 23 mEq/L (ref 19–32)
CREATININE: 0.66 mg/dL (ref 0.50–1.10)
Calcium: 10.2 mg/dL (ref 8.4–10.5)
GFR calc non Af Amer: 86 mL/min — ABNORMAL LOW (ref >90)
GLUCOSE: 91 mg/dL (ref 70–99)
Potassium: 4.4 mEq/L (ref 3.7–5.3)
Sodium: 136 mEq/L — ABNORMAL LOW (ref 137–147)
Total Protein: 8.1 g/dL (ref 6.0–8.3)

## 2014-04-26 LAB — CYTOLOGY - PAP

## 2014-04-26 MED ORDER — CHLORHEXIDINE GLUCONATE 4 % EX LIQD: 1.0000 "application " | Freq: Once | CUTANEOUS | Status: DC

## 2014-04-26 NOTE — Pre-Procedure Instructions (Signed)
Sandia Pfund  04/26/2014   Your procedure is scheduled on:  May 03, 2014  Report to [facility] Admitting at 8:30 AM.  Call this number if you have problems the morning of surgery: [phone]   Remember:   Do not eat food or drink liquids after midnight.   Take these medicines the morning of surgery with A SIP OF WATER: carvedilol (COREG), clonazePAM (KLONOPIN) if needed, eye drop     STOP ASPIRIN, FISH OIL, HERBAL SUPPLEMENTS, NSAIDS(IBUPROFEN) AS OF TODAY   Do not wear jewelry, make-up or nail polish.  Do not wear lotions, powders, or perfumes. You may wear deodorant.  Do not shave 48 hours prior to surgery.   Do not bring valuables to the hospital.  [facility] is not responsible for any belongings or valuables.               Contacts, dentures or bridgework may not be worn into surgery.  Leave suitcase in the car. After surgery it may be brought to your room.  For patients admitted to the hospital, discharge time is determined by your  treatment team.               Patients discharged the day of surgery will not be allowed to drive home.  Name and phone number of your driver: FAMILY/FRIEND     Please read over the following fact sheets that you were given: Pain Booklet, Coughing and Deep Breathing and Surgical Site Infection Prevention

## 2014-05-01 ENCOUNTER — Telehealth: Payer: Self-pay | Admitting: *Deleted

## 2014-05-01 ENCOUNTER — Ambulatory Visit: Payer: Medicare PPO | Admitting: Gynecology

## 2014-05-01 NOTE — Telephone Encounter (Signed)
Yes, make a same day appointment when the lesion shows up again

## 2014-05-01 NOTE — Telephone Encounter (Signed)
Pt informed, appointment will be canceled.

## 2014-05-01 NOTE — Telephone Encounter (Signed)
Pt called to follow up from [facility] on 04/25/14 was told to return per note "Patient will return back to the office later in the week for biopsy of the right buttock's area which appears to be scabs. She states this comes and goes and denies any history in the past of any sexually transmitted diseases" pt said this area is clear now, has appointment today at 2:30. Asked if okay to cancel since it has cleared? Please advise

## 2014-05-02 MED ORDER — CEFAZOLIN SODIUM-DEXTROSE 2-3 GM-% IV SOLR
2.0000 g | INTRAVENOUS | Status: AC
  Administered 2014-05-03: 2 g via INTRAVENOUS

## 2014-05-03 ENCOUNTER — Ambulatory Visit (HOSPITAL_COMMUNITY): Payer: Medicare PPO

## 2014-05-03 ENCOUNTER — Ambulatory Visit (HOSPITAL_COMMUNITY)
Admission: RE | Admit: 2014-05-03 | Discharge: 2014-05-03 | Disposition: A | Discharge status: Home / Self Care | Payer: Medicare PPO | Source: Ambulatory Visit | Attending: General Surgery | Admitting: General Surgery

## 2014-05-03 ENCOUNTER — Ambulatory Visit (HOSPITAL_COMMUNITY): Payer: Medicare PPO | Admitting: Certified Registered"

## 2014-05-03 ENCOUNTER — Encounter (HOSPITAL_COMMUNITY): Payer: Self-pay

## 2014-05-03 ENCOUNTER — Encounter (HOSPITAL_COMMUNITY)
Admission: RE | Disposition: A | Discharge status: Home / Self Care | Payer: Self-pay | Source: Ambulatory Visit | Attending: General Surgery

## 2014-05-03 DIAGNOSIS — K8010 Calculus of gallbladder with chronic cholecystitis without obstruction: Secondary | ICD-10-CM | POA: Insufficient documentation

## 2014-05-03 DIAGNOSIS — E780 Pure hypercholesterolemia: Secondary | ICD-10-CM | POA: Diagnosis not present

## 2014-05-03 DIAGNOSIS — K649 Unspecified hemorrhoids: Secondary | ICD-10-CM | POA: Diagnosis not present

## 2014-05-03 DIAGNOSIS — I10 Essential (primary) hypertension: Secondary | ICD-10-CM | POA: Insufficient documentation

## 2014-05-03 DIAGNOSIS — Z885 Allergy status to narcotic agent status: Secondary | ICD-10-CM | POA: Insufficient documentation

## 2014-05-03 DIAGNOSIS — E119 Type 2 diabetes mellitus without complications: Secondary | ICD-10-CM | POA: Insufficient documentation

## 2014-05-03 DIAGNOSIS — K8020 Calculus of gallbladder without cholecystitis without obstruction: Secondary | ICD-10-CM | POA: Diagnosis present

## 2014-05-03 DIAGNOSIS — Z419 Encounter for procedure for purposes other than remedying health state, unspecified: Secondary | ICD-10-CM

## 2014-05-03 HISTORY — DX: Type 2 diabetes mellitus without complications: E11.9

## 2014-05-03 HISTORY — PX: CHOLECYSTECTOMY: SHX55

## 2014-05-03 LAB — GLUCOSE, CAPILLARY
Glucose-Capillary: 84 mg/dL (ref 70–99)
Glucose-Capillary: 116 mg/dL — ABNORMAL HIGH (ref 70–99)

## 2014-05-03 SURGERY — LAPAROSCOPIC CHOLECYSTECTOMY WITH INTRAOPERATIVE CHOLANGIOGRAM
Anesthesia: General | Site: Abdomen

## 2014-05-03 MED ORDER — PROMETHAZINE HCL 25 MG/ML IJ SOLN
INTRAMUSCULAR | Status: AC
  Administered 2014-05-03: 6.25 mg
  Filled 2014-05-03: qty 1

## 2014-05-03 MED ORDER — PHENYLEPHRINE 40 MCG/ML (10ML) SYRINGE FOR IV PUSH (FOR BLOOD PRESSURE SUPPORT)
PREFILLED_SYRINGE | INTRAVENOUS | Status: AC
  Filled 2014-05-03: qty 10

## 2014-05-03 MED ORDER — PROMETHAZINE HCL 25 MG/ML IJ SOLN
6.2500 mg | Freq: Once | INTRAMUSCULAR | Status: AC
  Administered 2014-05-03 (×2): 6.25 mg via INTRAVENOUS

## 2014-05-03 MED ORDER — BUPIVACAINE-EPINEPHRINE 0.25% -1:200000 IJ SOLN
INTRAMUSCULAR | Status: DC | PRN
  Administered 2014-05-03: 30 mL

## 2014-05-03 MED ORDER — ROCURONIUM BROMIDE 50 MG/5ML IV SOLN
INTRAVENOUS | Status: AC
  Filled 2014-05-03: qty 1

## 2014-05-03 MED ORDER — FENTANYL CITRATE 0.05 MG/ML IJ SOLN
25.0000 ug | INTRAMUSCULAR | Status: DC | PRN
  Administered 2014-05-03: 25 ug via INTRAVENOUS

## 2014-05-03 MED ORDER — OXYCODONE HCL 5 MG PO TABS
ORAL_TABLET | ORAL | Status: AC
  Filled 2014-05-03: qty 1

## 2014-05-03 MED ORDER — 0.9 % SODIUM CHLORIDE (POUR BTL) OPTIME
TOPICAL | Status: DC | PRN
  Administered 2014-05-03: 1000 mL

## 2014-05-03 MED ORDER — OXYCODONE HCL 5 MG PO TABS
5.0000 mg | ORAL_TABLET | Freq: Once | ORAL | Status: AC | PRN
  Administered 2014-05-03: 5 mg via ORAL

## 2014-05-03 MED ORDER — FENTANYL CITRATE 0.05 MG/ML IJ SOLN
INTRAMUSCULAR | Status: DC | PRN
  Administered 2014-05-03: 100 ug via INTRAVENOUS
  Administered 2014-05-03 (×3): 50 ug via INTRAVENOUS

## 2014-05-03 MED ORDER — GLYCOPYRROLATE 0.2 MG/ML IJ SOLN
INTRAMUSCULAR | Status: DC | PRN
  Administered 2014-05-03: 0.6 mg via INTRAVENOUS

## 2014-05-03 MED ORDER — PROPOFOL 10 MG/ML IV BOLUS
INTRAVENOUS | Status: AC
  Filled 2014-05-03: qty 20

## 2014-05-03 MED ORDER — OXYCODONE HCL 5 MG/5ML PO SOLN: 5.0000 mg | Freq: Once | ORAL | Status: AC | PRN

## 2014-05-03 MED ORDER — IOHEXOL 300 MG/ML  SOLN
INTRAMUSCULAR | Status: DC | PRN
  Administered 2014-05-03: 50 mL

## 2014-05-03 MED ORDER — NEOSTIGMINE METHYLSULFATE 10 MG/10ML IV SOLN
INTRAVENOUS | Status: DC | PRN
Start: 2014-05-03 — End: 2014-05-03
  Administered 2014-05-03: 4 mg via INTRAVENOUS

## 2014-05-03 MED ORDER — LIDOCAINE HCL (CARDIAC) 20 MG/ML IV SOLN
INTRAVENOUS | Status: DC | PRN
  Administered 2014-05-03: 60 mg via INTRAVENOUS

## 2014-05-03 MED ORDER — SODIUM CHLORIDE 0.9 % IR SOLN
Status: DC | PRN
  Administered 2014-05-03: 1000 mL

## 2014-05-03 MED ORDER — FENTANYL CITRATE 0.05 MG/ML IJ SOLN
INTRAMUSCULAR | Status: AC
  Filled 2014-05-03: qty 5

## 2014-05-03 MED ORDER — ONDANSETRON HCL 4 MG/2ML IJ SOLN
INTRAMUSCULAR | Status: DC | PRN
  Administered 2014-05-03: 4 mg via INTRAVENOUS

## 2014-05-03 MED ORDER — LACTATED RINGERS IV SOLN
INTRAVENOUS | Status: DC | PRN
  Administered 2014-05-03 (×2): via INTRAVENOUS

## 2014-05-03 MED ORDER — FENTANYL CITRATE 0.05 MG/ML IJ SOLN
INTRAMUSCULAR | Status: DC
Start: 2014-05-03 — End: 2014-05-03
  Filled 2014-05-03: qty 2

## 2014-05-03 MED ORDER — CEFAZOLIN SODIUM-DEXTROSE 2-3 GM-% IV SOLR
INTRAVENOUS | Status: AC
  Filled 2014-05-03: qty 50

## 2014-05-03 MED ORDER — LACTATED RINGERS IV SOLN
INTRAVENOUS | Status: DC
  Administered 2014-05-03: 09:00:00 via INTRAVENOUS

## 2014-05-03 MED ORDER — PROPOFOL 10 MG/ML IV BOLUS
INTRAVENOUS | Status: DC | PRN
  Administered 2014-05-03: 100 mg via INTRAVENOUS

## 2014-05-03 MED ORDER — OXYCODONE-ACETAMINOPHEN 5-325 MG PO TABS: 1.0000 | ORAL_TABLET | ORAL | Status: DC | PRN

## 2014-05-03 MED ORDER — GLYCOPYRROLATE 0.2 MG/ML IJ SOLN
INTRAMUSCULAR | Status: AC
  Filled 2014-05-03: qty 3

## 2014-05-03 MED ORDER — ROCURONIUM BROMIDE 100 MG/10ML IV SOLN
INTRAVENOUS | Status: DC | PRN
  Administered 2014-05-03: 30 mg via INTRAVENOUS

## 2014-05-03 MED ORDER — NEOSTIGMINE METHYLSULFATE 10 MG/10ML IV SOLN
INTRAVENOUS | Status: AC
  Filled 2014-05-03: qty 1

## 2014-05-03 MED ORDER — SUCCINYLCHOLINE CHLORIDE 20 MG/ML IJ SOLN
INTRAMUSCULAR | Status: AC
  Filled 2014-05-03: qty 1

## 2014-05-03 MED ORDER — BUPIVACAINE-EPINEPHRINE (PF) 0.25% -1:200000 IJ SOLN
INTRAMUSCULAR | Status: AC
  Filled 2014-05-03: qty 30

## 2014-05-03 MED ORDER — SODIUM CHLORIDE 0.9 % IJ SOLN
INTRAMUSCULAR | Status: AC
  Filled 2014-05-03: qty 3

## 2014-05-03 SURGICAL SUPPLY — 34 items
APPLIER CLIP 5 13 M/L LIGAMAX5 (MISCELLANEOUS) ×3
CANISTER SUCTION 2500CC (MISCELLANEOUS) ×3 IMPLANT
CATH REDDICK CHOLANGI 4FR 50CM (CATHETERS) ×3 IMPLANT
CHLORAPREP W/TINT 26ML (MISCELLANEOUS) ×3 IMPLANT
CLIP APPLIE 5 13 M/L LIGAMAX5 (MISCELLANEOUS) ×1 IMPLANT
COVER MAYO STAND STRL (DRAPES) ×3 IMPLANT
COVER SURGICAL LIGHT HANDLE (MISCELLANEOUS) ×3 IMPLANT
DRAPE C-ARM 42X72 X-RAY (DRAPES) ×3 IMPLANT
DRAPE LAPAROSCOPIC ABDOMINAL (DRAPES) ×3 IMPLANT
ELECT REM PT RETURN 9FT ADLT (ELECTROSURGICAL) ×3
ELECTRODE REM PT RTRN 9FT ADLT (ELECTROSURGICAL) ×1 IMPLANT
GLOVE BIO SURGEON STRL SZ7.5 (GLOVE) ×6 IMPLANT
GLOVE BIOGEL PI IND STRL 7.5 (GLOVE) ×1 IMPLANT
GLOVE BIOGEL PI INDICATOR 7.5 (GLOVE) ×2
GLOVE ECLIPSE 7.5 STRL STRAW (GLOVE) ×3 IMPLANT
GOWN STRL REUS W/ TWL LRG LVL3 (GOWN DISPOSABLE) ×3 IMPLANT
GOWN STRL REUS W/TWL LRG LVL3 (GOWN DISPOSABLE) ×6
IV CATH 14GX2 1/4 (CATHETERS) ×3 IMPLANT
KIT BASIN OR (CUSTOM PROCEDURE TRAY) ×3 IMPLANT
KIT ROOM TURNOVER OR (KITS) ×3 IMPLANT
LIQUID BAND (GAUZE/BANDAGES/DRESSINGS) ×3 IMPLANT
NS IRRIG 1000ML POUR BTL (IV SOLUTION) ×3 IMPLANT
PAD ARMBOARD 7.5X6 YLW CONV (MISCELLANEOUS) ×3 IMPLANT
POUCH SPECIMEN RETRIEVAL 10MM (ENDOMECHANICALS) ×3 IMPLANT
SCISSORS LAP 5X35 DISP (ENDOMECHANICALS) ×3 IMPLANT
SET IRRIG TUBING LAPAROSCOPIC (IRRIGATION / IRRIGATOR) ×3 IMPLANT
SLEEVE ENDOPATH XCEL 5M (ENDOMECHANICALS) ×6 IMPLANT
SPECIMEN JAR SMALL (MISCELLANEOUS) ×3 IMPLANT
SUT MNCRL AB 4-0 PS2 18 (SUTURE) ×3 IMPLANT
TOWEL OR 17X26 10 PK STRL BLUE (TOWEL DISPOSABLE) ×3 IMPLANT
TRAY LAPAROSCOPIC (CUSTOM PROCEDURE TRAY) ×3 IMPLANT
TROCAR XCEL BLUNT TIP 100MML (ENDOMECHANICALS) ×3 IMPLANT
TROCAR XCEL NON-BLD 5MMX100MML (ENDOMECHANICALS) ×3 IMPLANT
TUBING INSUFFLATION (TUBING) ×3 IMPLANT

## 2014-05-03 NOTE — H&P (View-Only) (Signed)
Linda Barber 04/04/2014 12:06 PM Location: [facility] Patient #: [id number] DOB: 1941/01/17 Widowed / Language: Linda Barber / Race: Black or African American Female  History of Present Illness Linda Barber. Linda Starks MD; 04/04/2014 12:28 PM) Patient words: eval for gallstones.  The patient is a 73 year old female who presents with abdominal pain. We are asked to see the patient in consultation by Dr. Earlean Barber to evaluate her for gallstones. The patient is a 73 year old black female who is been experiencing abdominal pain for the last year and a half. The pain occurs about every 2-3 months. The pain she has been having has been mostly lower and central abdominal pain. The pain is been associated with nausea and vomiting. She has changed some of her medications and this is had no effect on her pain and nausea. She recently underwent an ultrasound which did show stones in the gallbladder but no gallbladder wall thickening or ductal dilatation.   Other Problems Linda Pierini, LPN; 42/87/6811 57:26 PM) Cancer Cholelithiasis Diabetes Mellitus Diverticulosis Hemorrhoids High blood pressure Hypercholesterolemia  Past Surgical History Linda Pierini, LPN; 20/35/5974 16:38 PM) Cataract Surgery Bilateral. Hemorrhoidectomy Hysterectomy (not due to cancer) - Complete  Diagnostic Studies History Linda Pierini, LPN; 45/36/4680 32:12 PM) Colonoscopy within last year Mammogram within last year Pap Smear 1-5 years ago  Allergies Linda Pierini, LPN; 24/82/5003 70:48 PM) Codeine Phosphate *ANALGESICS - OPIOID*  Medication History Linda Pierini, LPN; 88/91/6945 03:88 PM) ClonazePAM (Oral) Specific dose unknown - Active. Accu-Chek Aviva Plus (In Vitro) Specific dose unknown - Active. Carvedilol (Oral) Specific dose unknown - Active. Hyoscyamine Sulfate (Oral) Specific dose unknown - Active. Losartan Potassium (Oral) Specific dose unknown - Active. MetroNIDAZOLE (Oral)  Specific dose unknown - Active. Niacin ER (Antihyperlipidemic) (Oral) Specific dose unknown - Active. Premarin (Vaginal) Specific dose unknown - Active. Simvastatin (Oral) Specific dose unknown - Active. Thiothixene (Oral) Specific dose unknown - Active. Zocor (Oral) Specific dose unknown - Active. CoQ-10 & Fish Oil (120-180-50-30 Capsule, Oral) Active. Vitamin D (Oral) Specific dose unknown - Active. Multivitamin Adults 50+ (Oral) Specific dose unknown - Active. Aspirin EC (Oral) Specific dose unknown - Active.  Social History Linda Reichmann Gleason, LPN; 82/80/0349 17:91 PM) Caffeine use Carbonated beverages, Coffee, Tea. No alcohol use No drug use Tobacco use Never smoker.  Family History Linda Pierini, LPN; 50/56/9794 80:16 PM) Cerebrovascular Accident Mother. Heart Disease Mother. Hypertension Brother, Mother. Kidney Disease Brother.  Pregnancy / Birth History Linda Pierini, LPN; 55/37/4827 07:86 PM) Age at menarche 3 years. Age of menopause 84-55 Gravida 2 Maternal age 46-30 Para 2  Review of Systems Linda Reichmann Smithey LPN; 75/44/9201 00:71 PM) General Present- Weight Loss. Not Present- Appetite Loss, Chills, Fatigue, Fever, Night Sweats and Weight Gain. Skin Not Present- Change in Wart/Mole, Dryness, Hives, Jaundice, New Lesions, Non-Healing Wounds, Rash and Ulcer. HEENT Present- Seasonal Allergies and Wears glasses/contact lenses. Not Present- Earache, Hearing Loss, Hoarseness, Nose Bleed, Oral Ulcers, Ringing in the Ears, Sinus Pain, Sore Throat, Visual Disturbances and Yellow Eyes. Respiratory Not Present- Bloody sputum, Chronic Cough, Difficulty Breathing, Snoring and Wheezing. Breast Not Present- Breast Mass, Breast Pain, Nipple Discharge and Skin Changes. Cardiovascular Present- Rapid Heart Rate. Not Present- Chest Pain, Difficulty Breathing Lying Down, Leg Cramps, Palpitations, Shortness of Breath and Swelling of Extremities. Gastrointestinal Present-  Abdominal Pain and Vomiting. Not Present- Bloating, Bloody Stool, Change in Bowel Habits, Chronic diarrhea, Constipation, Difficulty Swallowing, Excessive gas, Gets full quickly at meals, Hemorrhoids, Indigestion, Nausea and Rectal Pain. Female Genitourinary Present- Frequency. Not Present- Nocturia,  Painful Urination, Pelvic Pain and Urgency. Musculoskeletal Not Present- Back Pain, Joint Pain, Joint Stiffness, Muscle Pain, Muscle Weakness and Swelling of Extremities. Neurological Not Present- Decreased Memory, Fainting, Headaches, Numbness, Seizures, Tingling, Tremor, Trouble walking and Weakness. Psychiatric Not Present- Anxiety, Bipolar, Change in Sleep Pattern, Depression, Fearful and Frequent crying. Endocrine Present- New Diabetes. Not Present- Cold Intolerance, Excessive Hunger, Hair Changes, Heat Intolerance and Hot flashes. Hematology Not Present- Easy Bruising, Excessive bleeding, Gland problems, HIV and Persistent Infections.   Vitals Linda Reichmann Smithey LPN; 13/12/6576 46:96 PM) 04/04/2014 12:13 PM Weight: 109 lb Height: 61in Body Surface Area: 1.46 m Body Mass Index: 20.6 kg/m Temp.: 70F(Oral)  Pulse: 70 (Regular)  Resp.: 18 (Unlabored)  BP: 134/82 (Sitting, Left Arm, Standard)    Physical Exam Linda Dibbles S. Linda Starks MD; 04/04/2014 12:28 PM) General Mental Status-Alert. General Appearance-Consistent with stated age. Hydration-Well hydrated. Voice-Normal.  Head and Neck Head-normocephalic, atraumatic with no lesions or palpable masses. Trachea-midline. Thyroid Gland Characteristics - normal size and consistency.  Eye Eyeball - Bilateral-Extraocular movements intact. Sclera/Conjunctiva - Bilateral-No scleral icterus.  Chest and Lung Exam Chest and lung exam reveals -quiet, even and easy respiratory effort with no use of accessory muscles and on auscultation, normal breath sounds, no adventitious sounds and normal vocal resonance. Inspection Chest  Wall - Normal. Back - normal.  Cardiovascular Cardiovascular examination reveals -normal heart sounds, regular rate and rhythm with no murmurs and normal pedal pulses bilaterally.  Abdomen Note: The abdomen is soft and nontender. There is no palpable mass. There is a well-healed lower midline scar.   Neurologic Neurologic evaluation reveals -alert and oriented x 3 with no impairment of recent or remote memory. Mental Status-Normal.  Musculoskeletal Normal Exam - Left-Upper Extremity Strength Normal and Lower Extremity Strength Normal. Normal Exam - Right-Upper Extremity Strength Normal and Lower Extremity Strength Normal.  Lymphatic Head & Neck  General Head & Neck Lymphatics: Bilateral - Description - Normal. Axillary  General Axillary Region: Bilateral - Description - Normal. Tenderness - Non Tender. Femoral & Inguinal  Generalized Femoral & Inguinal Lymphatics: Bilateral - Description - Normal. Tenderness - Non Tender.    Assessment & Plan Linda Dibbles S. Linda Starks MD; 04/04/2014 12:27 PM) Cholelithiasis Impression: The patient appears to have symptomatic cholelithiasis. Because of the risk of further painful episodes and possible pancreatitis think she would benefit from having her gallbladder removed. She would also like to have this done. I have discussed with her in detail the risks and benefits of the operation to remove the gallbladder as well as some of the technical aspects and she understands and wishes to proceed     Signed by Luella Cook, MD (04/04/2014 12:29 PM)

## 2014-05-03 NOTE — Anesthesia Postprocedure Evaluation (Signed)
  Anesthesia Post-op Note  Patient: Linda Barber  Procedure(s) Performed: Procedure(s): LAPAROSCOPIC CHOLECYSTECTOMY WITH INTRAOPERATIVE CHOLANGIOGRAM (N/A)  Patient Location: PACU  Anesthesia Type:General  Level of Consciousness: awake  Airway and Oxygen Therapy: Patient Spontanous Breathing  Post-op Pain: mild  Post-op Assessment: Post-op Vital signs reviewed, Patient's Cardiovascular Status Stable, Respiratory Function Stable, Patent Airway, No signs of Nausea or vomiting and Pain level controlled  Post-op Vital Signs: Reviewed and stable  Last Vitals:  Filed Vitals:   05/03/14 1338  BP: 139/63  Pulse: 64  Temp:   Resp: 15    Complications: No apparent anesthesia complications

## 2014-05-03 NOTE — Anesthesia Procedure Notes (Signed)
Procedure Name: Intubation Date/Time: 05/03/2014 10:30 AM Performed by: Maeola Harman Pre-anesthesia Checklist: Patient identified, Emergency Drugs available, Suction available, Patient being monitored and Timeout performed Patient Re-evaluated:Patient Re-evaluated prior to inductionOxygen Delivery Method: Circle system utilized Preoxygenation: Pre-oxygenation with 100% oxygen Intubation Type: IV induction Ventilation: Mask ventilation without difficulty Laryngoscope Size: Mac and 3 Grade View: Grade I Tube type: Oral Tube size: 7.5 mm Number of attempts: 1 Airway Equipment and Method: Stylet Placement Confirmation: ETT inserted through vocal cords under direct vision,  positive ETCO2 and breath sounds checked- equal and bilateral Secured at: 22 cm Tube secured with: Tape Dental Injury: Teeth and Oropharynx as per pre-operative assessment  Comments: Easy induction and intubation with MAC 3 blade.  Waldron Session, CRNA

## 2014-05-03 NOTE — Op Note (Signed)
05/03/2014  11:27 AM  PATIENT:  Linda Barber  73 y.o. female  PRE-OPERATIVE DIAGNOSIS:  Gallstones  POST-OPERATIVE DIAGNOSIS:  Gallstones  PROCEDURE:  Procedure(s): LAPAROSCOPIC CHOLECYSTECTOMY WITH INTRAOPERATIVE CHOLANGIOGRAM (N/A)  SURGEON:  Surgeon(s) and Role:    * Jovita Kussmaul, MD - Primary  PHYSICIAN ASSISTANT:   ASSISTANTS: Judyann Munson, RNFA   ANESTHESIA:   general  EBL:     BLOOD ADMINISTERED:none  DRAINS: none   LOCAL MEDICATIONS USED:  MARCAINE     SPECIMEN:  Source of Specimen:  gallbladder  DISPOSITION OF SPECIMEN:  PATHOLOGY  COUNTS:  YES  TOURNIQUET:  * No tourniquets in log *  DICTATION: .Dragon Dictation   Procedure: After informed consent was obtained the patient was brought to the operating room and placed in the supine position on the operating room table. After adequate induction of general anesthesia the patient's abdomen was prepped with ChloraPrep allowed to dry and draped in usual sterile manner. The area below the umbilicus was infiltrated with quarter percent  Marcaine. A small incision was made with a 15 blade knife. The incision was carried down through the subcutaneous tissue bluntly with a hemostat and [employer] retractors. The linea alba was identified. The linea alba was incised with a 15 blade knife and each side was grasped with Coker clamps. The preperitoneal space was then probed with a hemostat until the peritoneum was opened and access was gained to the abdominal cavity. A 0 Vicryl pursestring stitch was placed in the fascia surrounding the opening. A Hassan cannula was then placed through the opening and anchored in place with the previously placed Vicryl purse string stitch. The abdomen was insufflated with carbon dioxide without difficulty. A laparoscope was inserted through the [facility] cannula in the right upper quadrant was inspected. Next the epigastric region was infiltrated with % Marcaine. A small incision was made with a  15 blade knife. A 5 mm port was placed bluntly through this incision into the abdominal cavity under direct vision. Next 2 sites were chosen laterally on the right side of the abdomen for placement of 5 mm ports. Each of these areas was infiltrated with quarter percent Marcaine. Small stab incisions were made with a 15 blade knife. 5 mm ports were then placed bluntly through these incisions into the abdominal cavity under direct vision without difficulty. A blunt grasper was placed through the lateralmost 5 mm port and used to grasp the dome of the gallbladder and elevated anteriorly and superiorly. Another blunt grasper was placed through the other 5 mm port and used to retract the body and neck of the gallbladder. A dissector was placed through the epigastric port and using the electrocautery the peritoneal reflection at the gallbladder neck was opened. Blunt dissection was then carried out in this area until the gallbladder neck-cystic duct junction was readily identified and a good window was created. A single clip was placed on the gallbladder neck. A small  ductotomy was made just below the clip with laparoscopic scissors. A 14-gauge Angiocath was then placed through the anterior abdominal wall under direct vision. A Reddick cholangiogram catheter was then placed through the Angiocath and flushed. The catheter was then placed in the cystic duct and anchored in place with a clip. A cholangiogram was obtained that showed no filling defects good emptying into the duodenum an adequate length on the cystic duct. The anchoring clip and catheters were then removed from the patient. 3 clips were placed proximally on the cystic duct and the  duct was divided between the 2 sets of clips. Posterior to this the cystic artery was identified and again dissected bluntly in a circumferential manner until a good window  was created. 2 clips were placed proximally and one distally on the artery and the artery was divided between  the 2 sets of clips. Next a laparoscopic hook cautery device was used to separate the gallbladder from the liver bed. Prior to completely detaching the gallbladder from the liver bed the liver bed was inspected and several small bleeding points were coagulated with the electrocautery until the area was completely hemostatic. The gallbladder was then detached the rest of it from the liver bed without difficulty. A laparoscopic bag was inserted through the [facility] canula. The gallbladder was placed within the bag and the bag was sealed. A laparoscope was then moved to the epigastric port. The bag with the gallbladder was then removed with the [facility] cannula through the infraumbilical port without difficulty. The fascial defect was then closed with the previously placed Vicryl pursestring stitch as well as with another figure-of-eight 0 Vicryl stitch. The liver bed was inspected again and found to be hemostatic. The abdomen was irrigated with copious amounts of saline until the effluent was clear. The ports were then removed under direct vision without difficulty and were found to be hemostatic. The gas was allowed to escape. The skin incisions were all closed with interrupted 4-0 Monocryl subcuticular stitches. Dermabond dressings were applied. The patient tolerated the procedure well. At the end of the case all needle sponge and instrument counts were correct. The patient was then awakened and taken to recovery in stable condition  PLAN OF CARE: Discharge to home after PACU  PATIENT DISPOSITION:  PACU - hemodynamically stable.   Delay start of Pharmacological VTE agent (>24hrs) due to surgical blood loss or risk of bleeding: not applicable

## 2014-05-03 NOTE — Discharge Instructions (Signed)

## 2014-05-03 NOTE — Anesthesia Preprocedure Evaluation (Addendum)
Anesthesia Evaluation  Patient identified by MRN, date of birth, ID band Patient awake    Reviewed: Allergy & Precautions, H&P , NPO status , Patient's Chart, lab work & pertinent test results  History of Anesthesia Complications Negative for: history of anesthetic complications  Airway Mallampati: I  TM Distance: >3 FB     Dental  (+) Teeth Intact, Dental Advisory Given   Pulmonary neg pulmonary ROS,  breath sounds clear to auscultation        Cardiovascular hypertension, Pt. on medications and Pt. on home beta blockers - angina- Past MI and - CHF Rhythm:Regular     Neuro/Psych negative neurological ROS  negative psych ROS   GI/Hepatic negative GI ROS, Neg liver ROS,   Endo/Other  diabetes, Well Controlled, Type 2  Renal/GU      Musculoskeletal negative musculoskeletal ROS (+)   Abdominal (+)  Abdomen: soft. Bowel sounds: normal.  Peds  Hematology negative hematology ROS (+)   Anesthesia Other Findings   Reproductive/Obstetrics negative OB ROS                          Anesthesia Physical Anesthesia Plan  ASA: II  Anesthesia Plan: General   Post-op Pain Management:    Induction: Intravenous  Airway Management Planned: Oral ETT  Additional Equipment: None  Intra-op Plan:   Post-operative Plan: Extubation in OR  Informed Consent: I have reviewed the patients History and Physical, chart, labs and discussed the procedure including the risks, benefits and alternatives for the proposed anesthesia with the patient or authorized representative who has indicated his/her understanding and acceptance.   Dental advisory given  Plan Discussed with: CRNA and Surgeon  Anesthesia Plan Comments:         Anesthesia Quick Evaluation

## 2014-05-03 NOTE — Interval H&P Note (Signed)
History and Physical Interval Note:  05/03/2014 9:29 AM  Linda Barber  has presented today for surgery, with the diagnosis of gallstones  The various methods of treatment have been discussed with the patient and family. After consideration of risks, benefits and other options for treatment, the patient has consented to  Procedure(s): LAPAROSCOPIC CHOLECYSTECTOMY WITH INTRAOPERATIVE CHOLANGIOGRAM (N/A) as a surgical intervention .  The patient's history has been reviewed, patient examined, no change in status, stable for surgery.  I have reviewed the patient's chart and labs.  Questions were answered to the patient's satisfaction.     TOTH III,PAUL S

## 2014-05-03 NOTE — Transfer of Care (Signed)
Immediate Anesthesia Transfer of Care Note  Patient: Linda Barber  Procedure(s) Performed: Procedure(s): LAPAROSCOPIC CHOLECYSTECTOMY WITH INTRAOPERATIVE CHOLANGIOGRAM (N/A)  Patient Location: PACU  Anesthesia Type:General  Level of Consciousness: awake, alert  and oriented  Airway & Oxygen Therapy: Patient connected to face mask oxygen  Post-op Assessment: Report given to PACU RN  Post vital signs: stable  Complications: No apparent anesthesia complications

## 2014-05-05 ENCOUNTER — Encounter (HOSPITAL_COMMUNITY): Payer: Self-pay | Admitting: General Surgery

## 2014-05-29 ENCOUNTER — Encounter: Payer: Medicare PPO | Attending: Family Medicine | Admitting: Dietician

## 2014-05-29 ENCOUNTER — Encounter: Payer: Self-pay | Admitting: Dietician

## 2014-05-29 VITALS — Ht 61.0 in | Wt 106.0 lb

## 2014-05-29 DIAGNOSIS — E119 Type 2 diabetes mellitus without complications: Secondary | ICD-10-CM | POA: Diagnosis not present

## 2014-05-29 DIAGNOSIS — R634 Abnormal weight loss: Secondary | ICD-10-CM | POA: Insufficient documentation

## 2014-05-29 DIAGNOSIS — Z713 Dietary counseling and surveillance: Secondary | ICD-10-CM | POA: Insufficient documentation

## 2014-05-29 NOTE — Patient Instructions (Addendum)
Have protein with carbs at snacks (see list). Try having 1% milk instead of fat free (drink one glass per day instead of orange juice). Drink Diet Cranberry/Splash instead of orange juice. Try full fat cheese. If your stomach hurts, eat less fat at one time.  Try a [facility] [employer] for a snack instead of diabetic cookies.  If want cake, have it at lunch with just protein and vegetables (skip sweet potatoes, rice, and/or beans). Think about doing some weight bearing exercise.

## 2014-05-29 NOTE — Progress Notes (Signed)
Medical Nutrition Therapy:  Appt start time: 3785 end time:  1125.  Assessment:  Primary concerns today: Linda Barber is here today since she lost some weight in the past year. Highest weight was 132 lbs 8 years ago and was diagnosed with pre-diabetes (diabetes). Was told to eat a low fat and low sodium diet and lost weight down to 110 lbs. Current weight is 106 lbs and would like to gain about 4 lbs. Lost weight when she was sick with gall bladder and can't seem to gain more weight. Concerned that increasing food will make her blood sugar go up. Eats 1400-1600 calories per day.   Has seen a dietitian before who told her how to gain some weight. Was adding healthy fats. Last Hgb A1c was 6.0%. Testing blood sugar sporadically and has a prescription for test strips. Blood sugar rarely goes over 100 mg/dl fasting.   Lives with her son and they shop together and she does the meal preparation. Does not miss or skip meals.  Does not eat out regularly.   Trying to avoid calcium since her calcium was in the upper normal range. Told that she does not need to watch her calcium intake.   Appetite is good. Since having her gall bladder out has not had issues with nausea or diarrhea.   Preferred Learning Style:   No preference indicated   Learning Readiness:  Ready  MEDICATIONS: see list   DIETARY INTAKE:  Usual eating pattern includes 3 meals and 2 snacks per day.  Avoided foods include red meat (rarely eats it)  24-hr recall:  B (4 -5 AM): 1/2 cup oatmeal cooked with cinnamon, peanut butter (1 Tb), 1/4 cup fruit with a small amount of milk and 1/4 cup egg substitute with green tea Snk (8 AM): 4 triscuits and 2 Tb tuna fish and 1 Tb peanut butter with 1/2 cup orange juice L ( PM):chicken, fish, or [place] leg with 1/3 cup sweet potatoes, black eyed peas, and collard greens and 1/2 piece fruit with "diabetic" cookies (sometimes) Snk ( PM): reduced fat potato chips (1-2 x week) or popcorn or cookies or  triscuit/crackers and peanut butter or cheese  D (6 PM): sandwich with lunch meat with 1/3 cup of sweet potatoes or french fries Snk ( PM): none usually, though might have crackers have midnight Beverages: water, green tea with lemon juice and stevia, diet pepsi, coffee, V-8 Diet Splash, Diet cranberry juice    Usual physical activity: walks on treadmill for 30 minutes everyday  Estimated energy needs: 1600 calories 180 g carbohydrates 120 g protein 44 g fat  Progress Towards Goal(s):  In progress.   Nutritional Diagnosis:  [place]-3.2 Unintentional weight loss As related to hx of gallbladder surgery.  As evidenced by 4 lb weight loss in the last few months.    Intervention:  Nutrition counseling provided. Plan: Have protein with carbs at snacks (see list). Try having 1% milk instead of fat free (drink one glass per day instead of orange juice). Drink Diet Cranberry/Splash instead of orange juice. Try full fat cheese. If your stomach hurts, eat less fat at one time.  Try a [facility] [employer] for a snack instead of diabetic cookies.  If want cake, have it at lunch with just protein and vegetables (skip sweet potatoes, rice, and/or beans). Think about doing some weight bearing exercise.   Teaching Method Utilized:  Visual Auditory Hands on  Handouts given during visit include:  15 g CHO Snacks  Barriers  to learning/adherence to lifestyle change: none  Demonstrated degree of understanding via:  Teach Back   Monitoring/Evaluation:  Dietary intake, exercise, and body weight prn.

## 2014-10-23 DIAGNOSIS — I519 Heart disease, unspecified: Secondary | ICD-10-CM | POA: Diagnosis not present

## 2014-10-23 DIAGNOSIS — B159 Hepatitis A without hepatic coma: Secondary | ICD-10-CM | POA: Diagnosis not present

## 2014-10-23 DIAGNOSIS — D510 Vitamin B12 deficiency anemia due to intrinsic factor deficiency: Secondary | ICD-10-CM | POA: Diagnosis not present

## 2014-10-23 DIAGNOSIS — Z7901 Long term (current) use of anticoagulants: Secondary | ICD-10-CM | POA: Diagnosis not present

## 2014-10-23 DIAGNOSIS — R7309 Other abnormal glucose: Secondary | ICD-10-CM | POA: Diagnosis not present

## 2014-11-28 ENCOUNTER — Encounter: Payer: Self-pay | Admitting: Gynecology

## 2014-11-28 DIAGNOSIS — Z1231 Encounter for screening mammogram for malignant neoplasm of breast: Secondary | ICD-10-CM | POA: Diagnosis not present

## 2015-01-18 ENCOUNTER — Other Ambulatory Visit: Payer: Self-pay | Admitting: *Deleted

## 2015-01-18 DIAGNOSIS — M8580 Other specified disorders of bone density and structure, unspecified site: Secondary | ICD-10-CM

## 2015-01-23 DIAGNOSIS — I519 Heart disease, unspecified: Secondary | ICD-10-CM | POA: Diagnosis not present

## 2015-01-23 DIAGNOSIS — R7309 Other abnormal glucose: Secondary | ICD-10-CM | POA: Diagnosis not present

## 2015-01-23 DIAGNOSIS — E559 Vitamin D deficiency, unspecified: Secondary | ICD-10-CM | POA: Diagnosis not present

## 2015-01-31 DIAGNOSIS — E118 Type 2 diabetes mellitus with unspecified complications: Secondary | ICD-10-CM | POA: Diagnosis not present

## 2015-01-31 DIAGNOSIS — I10 Essential (primary) hypertension: Secondary | ICD-10-CM | POA: Diagnosis not present

## 2015-02-02 ENCOUNTER — Telehealth: Payer: Self-pay | Admitting: *Deleted

## 2015-02-02 MED ORDER — ESTROGENS, CONJUGATED 0.625 MG/GM VA CREA: TOPICAL_CREAM | VAGINAL | Status: DC

## 2015-02-02 NOTE — Telephone Encounter (Signed)
Pt aware Rx sent.  

## 2015-02-02 NOTE — Telephone Encounter (Signed)
Call in prescription for Premarin vaginal cream patient to apply a small amount intravaginally twice a week. One tube with 5 refills

## 2015-02-02 NOTE — Telephone Encounter (Signed)
Pt called requesting to switch back to premarin vaginal cream, states the compound estradiol is not working for her. She has a difficult time getting cream inserted and the drive to [place] is too far. Please advise

## 2015-03-06 DIAGNOSIS — H40033 Anatomical narrow angle, bilateral: Secondary | ICD-10-CM | POA: Diagnosis not present

## 2015-03-06 DIAGNOSIS — E119 Type 2 diabetes mellitus without complications: Secondary | ICD-10-CM | POA: Diagnosis not present

## 2015-04-27 ENCOUNTER — Encounter: Payer: Self-pay | Admitting: Gynecology

## 2015-04-27 ENCOUNTER — Ambulatory Visit (INDEPENDENT_AMBULATORY_CARE_PROVIDER_SITE_OTHER): Payer: Medicare PPO | Admitting: Gynecology

## 2015-04-27 VITALS — BP 120/76 | Ht 61.0 in | Wt 106.0 lb

## 2015-04-27 DIAGNOSIS — D071 Carcinoma in situ of vulva: Secondary | ICD-10-CM

## 2015-04-27 DIAGNOSIS — M8580 Other specified disorders of bone density and structure, unspecified site: Secondary | ICD-10-CM

## 2015-04-27 DIAGNOSIS — Z01419 Encounter for gynecological examination (general) (routine) without abnormal findings: Secondary | ICD-10-CM | POA: Diagnosis not present

## 2015-04-27 NOTE — Patient Instructions (Signed)

## 2015-04-27 NOTE — Progress Notes (Signed)
Linda Barber 09-Feb-1941 [id number]   History:    74 y.o.  for annual gyn exam who believes that time she has had outbreaks of vulvar herpes but none present today. She used Blistec which she applied when it appeared and went away. She is not sexually active. Patient with past history of total abdominal hysterectomy with bilateral salpingo-oophorectomy. in 1995 for uterine leiomyoma. Patient also has a history of vulvar carcinoma in situ excised in 2008 with no residual or evidence of recurrence reported. Patient's primary care physician is Dr. Iona Beard who has been doing patient's lab work. Patient had a colonoscopy in 2014 which was reported to be normal. Patient also has had a history of hemorrhoidectomy. Patient stated that many years ago she had been on Actonel for a few months. Patient had a bone density study done here in our office in January of this year and although a different manufacturer was utilized by her previous provider where she had her other scan on the T scores were compared. Her lowest T score was -1.1 at the right femoral neck and she had a normal Frax analysis. Her AP T score was -2.2. We compare with 2012 no significant change based on T score only. Patient with no past history of any abnormal Pap smears. Patient states that her flu vaccine as well as shingles vaccine are up-to-date.  Past medical history,surgical history, family history and social history were all reviewed and documented in the EPIC chart.  Gynecologic History No LMP recorded. Patient has had a hysterectomy. Contraception: post menopausal status Last Pap: 2014. Results were: normal Last mammogram: 2016. Results were: normal  Obstetric History OB History  Gravida Para Term Preterm AB SAB TAB Ectopic Multiple Living  2 2        2     # Outcome Date GA Lbr Len/2nd Weight Sex Delivery Anes PTL Lv  2 Para           1 Para                ROS: A ROS was performed and pertinent positives and  negatives are included in the history.  GENERAL: No fevers or chills. HEENT: No change in vision, no earache, sore throat or sinus congestion. NECK: No pain or stiffness. CARDIOVASCULAR: No chest pain or pressure. No palpitations. PULMONARY: No shortness of breath, cough or wheeze. GASTROINTESTINAL: No abdominal pain, nausea, vomiting or diarrhea, melena or bright red blood per rectum. GENITOURINARY: No urinary frequency, urgency, hesitancy or dysuria. MUSCULOSKELETAL: No joint or muscle pain, no back pain, no recent trauma. DERMATOLOGIC: No rash, no itching, no lesions. ENDOCRINE: No polyuria, polydipsia, no heat or cold intolerance. No recent change in weight. HEMATOLOGICAL: No anemia or easy bruising or bleeding. NEUROLOGIC: No headache, seizures, numbness, tingling or weakness. PSYCHIATRIC: No depression, no loss of interest in normal activity or change in sleep pattern.     Exam: chaperone present  BP 120/76 mmHg  Ht 5\' 1"  (1.549 m)  Wt 106 lb (48.081 kg)  BMI 20.04 kg/m2  Body mass index is 20.04 kg/(m^2).  General appearance : Well developed well nourished female. No acute distress HEENT: Eyes: no retinal hemorrhage or exudates,  Neck supple, trachea midline, no carotid bruits, no thyroidmegaly Lungs: Clear to auscultation, no rhonchi or wheezes, or rib retractions  Heart: Regular rate and rhythm, no murmurs or gallops Breast:Examined in sitting and supine position were symmetrical in appearance, no palpable masses or tenderness,  no skin retraction,  no nipple inversion, no nipple discharge, no skin discoloration, no axillary or supraclavicular lymphadenopathy Abdomen: no palpable masses or tenderness, no rebound or guarding Extremities: no edema or skin discoloration or tenderness  Pelvic:  Bartholin, Urethra, Skene Glands: Within normal limits             Vagina: No gross lesions or discharge, atrophic changes  Cervix: Absent  Uterus absent  Adnexa  Without masses or  tenderness  Anus and perineum  normal   Rectovaginal  normal sphincter tone without palpated masses or tenderness             Hemoccult PCP provides     Assessment/Plan:  74 y.o. female for annual exam with history of osteopenia schedule have her bone density study next month. We discussed importance of calcium vitamin D and regular exercise for osteoporosis prevention. With the use of 95 lens external genitalia was evaluated no evidence of any lesions noted. Patient several years ago with history of VIN III with wide local excision and reported negative margins. Her PCP has been doing her blood work. Her vaccines are up-to-date. Pap smear no longer indicated according to the new guidelines.   Terrance Mass MD, 3:01 PM 04/27/2015

## 2015-04-30 DIAGNOSIS — E118 Type 2 diabetes mellitus with unspecified complications: Secondary | ICD-10-CM | POA: Diagnosis not present

## 2015-04-30 DIAGNOSIS — E162 Hypoglycemia, unspecified: Secondary | ICD-10-CM | POA: Diagnosis not present

## 2015-04-30 DIAGNOSIS — E569 Vitamin deficiency, unspecified: Secondary | ICD-10-CM | POA: Diagnosis not present

## 2015-04-30 DIAGNOSIS — I10 Essential (primary) hypertension: Secondary | ICD-10-CM | POA: Diagnosis not present

## 2015-04-30 DIAGNOSIS — R7989 Other specified abnormal findings of blood chemistry: Secondary | ICD-10-CM | POA: Diagnosis not present

## 2015-05-07 DIAGNOSIS — E119 Type 2 diabetes mellitus without complications: Secondary | ICD-10-CM | POA: Diagnosis not present

## 2015-05-07 DIAGNOSIS — I10 Essential (primary) hypertension: Secondary | ICD-10-CM | POA: Diagnosis not present

## 2015-06-12 ENCOUNTER — Other Ambulatory Visit: Payer: Self-pay | Admitting: Gynecology

## 2015-06-12 ENCOUNTER — Ambulatory Visit (INDEPENDENT_AMBULATORY_CARE_PROVIDER_SITE_OTHER): Payer: Medicare Other

## 2015-06-12 DIAGNOSIS — M810 Age-related osteoporosis without current pathological fracture: Secondary | ICD-10-CM

## 2015-06-12 DIAGNOSIS — M8580 Other specified disorders of bone density and structure, unspecified site: Secondary | ICD-10-CM

## 2015-06-14 ENCOUNTER — Other Ambulatory Visit: Payer: Self-pay | Admitting: *Deleted

## 2015-06-14 ENCOUNTER — Other Ambulatory Visit: Payer: Self-pay | Admitting: Gynecology

## 2015-06-14 ENCOUNTER — Other Ambulatory Visit: Payer: 59

## 2015-06-14 DIAGNOSIS — M810 Age-related osteoporosis without current pathological fracture: Secondary | ICD-10-CM

## 2015-06-15 LAB — PARATHYROID HORMONE, INTACT (NO CA): PTH: 47 pg/mL (ref 14–64)

## 2015-06-19 ENCOUNTER — Ambulatory Visit (INDEPENDENT_AMBULATORY_CARE_PROVIDER_SITE_OTHER): Payer: Medicare Other | Admitting: Gynecology

## 2015-06-19 ENCOUNTER — Encounter: Payer: Self-pay | Admitting: Gynecology

## 2015-06-19 VITALS — BP 130/78

## 2015-06-19 DIAGNOSIS — M810 Age-related osteoporosis without current pathological fracture: Secondary | ICD-10-CM | POA: Diagnosis not present

## 2015-06-19 MED ORDER — ALENDRONATE SODIUM 70 MG PO TABS: 70.0000 mg | ORAL_TABLET | ORAL | Status: DC

## 2015-06-19 NOTE — Patient Instructions (Signed)
Alendronate tablets What is this medicine? ALENDRONATE (a LEN droe nate) slows calcium loss from bones. It helps to make normal healthy bone and to slow bone loss in people with Paget's disease and osteoporosis. It may be used in others at risk for bone loss. This medicine may be used for other purposes; ask your health care provider or pharmacist if you have questions. What should I tell my health care provider before I take this medicine? They need to know if you have any of these conditions: -dental disease -esophagus, stomach, or intestine problems, like acid reflux or GERD -kidney disease -low blood calcium -low vitamin D -problems sitting or standing 30 minutes -trouble swallowing -an unusual or allergic reaction to alendronate, other medicines, foods, dyes, or preservatives -pregnant or trying to get pregnant -breast-feeding How should I use this medicine? You must take this medicine exactly as directed or you will lower the amount of the medicine you absorb into your body or you may cause yourself harm. Take this medicine by mouth first thing in the morning, after you are up for the day. Do not eat or drink anything before you take your medicine. Swallow the tablet with a full glass (6 to 8 fluid ounces) of plain water. Do not take this medicine with any other drink. Do not chew or crush the tablet. After taking this medicine, do not eat breakfast, drink, or take any medicines or vitamins for at least 30 minutes. Sit or stand up for at least 30 minutes after you take this medicine; do not lie down. Do not take your medicine more often than directed. Talk to your pediatrician regarding the use of this medicine in children. Special care may be needed. Overdosage: If you think you have taken too much of this medicine contact a poison control center or emergency room at once. NOTE: This medicine is only for you. Do not share this medicine with others. What if I miss a dose? If you miss a  dose, do not take it later in the day. Continue your normal schedule starting the next morning. Do not take double or extra doses. What may interact with this medicine? -aluminum hydroxide -antacids -aspirin -calcium supplements -drugs for inflammation like ibuprofen, naproxen, and others -iron supplements -magnesium supplements -vitamins with minerals This list may not describe all possible interactions. Give your health care provider a list of all the medicines, herbs, non-prescription drugs, or dietary supplements you use. Also tell them if you smoke, drink alcohol, or use illegal drugs. Some items may interact with your medicine. What should I watch for while using this medicine? Visit your doctor or health care professional for regular checks ups. It may be some time before you see benefit from this medicine. Do not stop taking your medicine except on your doctor's advice. Your doctor or health care professional may order blood tests and other tests to see how you are doing. You should make sure you get enough calcium and vitamin D while you are taking this medicine, unless your doctor tells you not to. Discuss the foods you eat and the vitamins you take with your health care professional. Some people who take this medicine have severe bone, joint, and/or muscle pain. This medicine may also increase your risk for a broken thigh bone. Tell your doctor right away if you have pain in your upper leg or groin. Tell your doctor if you have any pain that does not go away or that gets worse. This medicine can make  you more sensitive to the sun. If you get a rash while taking this medicine, sunlight may cause the rash to get worse. Keep out of the sun. If you cannot avoid being in the sun, wear protective clothing and use sunscreen. Do not use sun lamps or tanning beds/booths. What side effects may I notice from receiving this medicine? Side effects that you should report to your doctor or health care  professional as soon as possible: -allergic reactions like skin rash, itching or hives, swelling of the face, lips, or tongue -black or tarry stools -bone, muscle or joint pain -changes in vision -chest pain -heartburn or stomach pain -jaw pain, especially after dental work -pain or trouble when swallowing -redness, blistering, peeling or loosening of the skin, including inside the mouth Side effects that usually do not require medical attention (report to your doctor or health care professional if they continue or are bothersome): -changes in taste -diarrhea or constipation -eye pain or itching -headache -nausea or vomiting -stomach gas or fullness This list may not describe all possible side effects. Call your doctor for medical advice about side effects. You may report side effects to FDA at 1-800-FDA-1088. Where should I keep my medicine? Keep out of the reach of children. Store at room temperature of 15 and 30 degrees C (59 and 86 degrees F). Throw away any unused medicine after the expiration date. NOTE: This sheet is a summary. It may not cover all possible information. If you have questions about this medicine, talk to your doctor, pharmacist, or health care provider.    2016, Elsevier/Gold Standard. (2010-11-01 08:56:09) Osteoporosis Osteoporosis is the thinning and loss of density in the bones. Osteoporosis makes the bones more brittle, fragile, and likely to break (fracture). Over time, osteoporosis can cause the bones to become so weak that they fracture after a simple fall. The bones most likely to fracture are the bones in the hip, wrist, and spine. CAUSES  The exact cause is not known. RISK FACTORS Anyone can develop osteoporosis. You may be at greater risk if you have a family history of the condition or have poor nutrition. You may also have a higher risk if you are:   Female.   22 years old or older.  A smoker.  Not physically active.   White or  Asian.  Slender. SIGNS AND SYMPTOMS  A fracture might be the first sign of the disease, especially if it results from a fall or injury that would not usually cause a bone to break. Other signs and symptoms include:   Low back and neck pain.  Stooped posture.  Height loss. DIAGNOSIS  To make a diagnosis, your health care provider may:  Take a medical history.  Perform a physical exam.  Order tests, such as:  A bone mineral density test.  A dual-energy X-ray absorptiometry test. TREATMENT  The goal of osteoporosis treatment is to strengthen your bones to reduce your risk of a fracture. Treatment may involve:  Making lifestyle changes, such as:  Eating a diet rich in calcium.  Doing weight-bearing and muscle-strengthening exercises.  Stopping tobacco use.  Limiting alcohol intake.  Taking medicine to slow the process of bone loss or to increase bone density.  Monitoring your levels of calcium and vitamin D. HOME CARE INSTRUCTIONS  Include calcium and vitamin D in your diet. Calcium is important for bone health, and vitamin D helps the body absorb calcium.  Perform weight-bearing and muscle-strengthening exercises as directed by your health care  provider.  Do not use any tobacco products, including cigarettes, chewing tobacco, and electronic cigarettes. If you need help quitting, ask your health care provider.  Limit your alcohol intake.  Take medicines only as directed by your health care provider.  Keep all follow-up visits as directed by your health care provider. This is important.  Take precautions at home to lower your risk of falling, such as:  Keeping rooms well lit and clutter free.  Installing safety rails on stairs.  Using rubber mats in the bathroom and other areas that are often wet or slippery. SEEK IMMEDIATE MEDICAL CARE IF:  You fall or injure yourself.    This information is not intended to replace advice given to you by your health care  provider. Make sure you discuss any questions you have with your health care provider.   Document Released: 02/12/2005 Document Revised: 05/26/2014 Document Reviewed: 10/13/2013 Elsevier Interactive Patient Education [employer].

## 2015-06-19 NOTE — Progress Notes (Signed)
   Patient is a 75 year old who presented to the office today to discuss her most recent bone density study. Patient many years ago had been on Actonel for only a few months started by another provider. We have no documentation. In January 2015 patient had a bone density study here in our office although a different manufacturer was utilized by her previous provider where she had her other scan. Her lowest T score was -1.1 at the right femoral neck and she had a normal Frax analysis. Her AP T score was -2.2. We compare with 2012 no significant change based on T score only.  Patient's bone density study done here in our office a few weeks ago was compare with a 2015 study. Lowest T score was now -2.6 at the AP spine with a statistically significant decrease in bone mineralization of -6.3%. Left femoral neck T score -1.8 with statistically significant decrease of bone mineralization of -10.5%. Right femoral neck no statistically significant change.  Patient had a calcium and vitamin D level done at her PCP office recently which was normal we did a PTH level here in the office which was also normal. Based on patient's bone density her bone mass is 25% below normal and she has a 5 times greater risk of a spinal fracture and a 7 times greater risk of a hip fracture. We discussed different treatment options to include the following: Prolia every 6 months injection, Reclast yearly IV infusion, Actonel monthly tablet, Fosamax weekly tablet. Patient would like to proceed with a weekly tablet. The following also was discussed:  The risk and benefits of oral bisphosphonate therapy were conveyed to the patient in today's visit. Benefits include a significant risk reduction of vertebral, hip and non vertebral fractures. We also discussed potential adverse effects to include precipitation or aggravation of GERD reflux disease as well as the risk of upper GI bleeding although this is reported in the literature to be  extremely rare. Other rare adverse effects that were discussed of patient taking oral bisphosphonate included a 1 in 10,000-10,000 risk for osteonecrosis of the jaw, and a risk for atypical femoral fractures of approximately 1 in 5000-10,000. Signs and symptoms of atypical femoral fracture were also discussed and the patient was informed to contact our office if any unusual symptoms were to develop.  We'll repeat a bone density study in one year to monitor response of therapy. Literature information was provided on osteoporosis as well as on Fosamax. All questions were answered. Greater than 50% of the time was spent in counseling cord any calf for this patient with osteoporosis.

## 2015-07-03 ENCOUNTER — Telehealth: Payer: Self-pay | Admitting: Gynecology

## 2015-07-03 NOTE — Telephone Encounter (Signed)
PC to pt she had question regarding ethnic group on bone density report. I explained the NHANES chart and that we do not use ethnic groups on the bone density chart for hispanic, white and afro [place] females. She understands. I also explained that we do included this information on her report.

## 2015-10-09 IMAGING — RF DG CHOLANGIOGRAM OPERATIVE
1 series · 4 of 4 positions shown · non-contrast
Comparison: Abdominal ultrasound on 03/30/2014

CLINICAL DATA: Cholecystectomy for cholelithiasis.

EXAM:
INTRAOPERATIVE CHOLANGIOGRAM
TECHNIQUE: Cholangiographic images from the C-arm fluoroscopic device were
submitted for interpretation post-operatively. Please see the
procedural report for the amount of contrast and the fluoroscopy
time utilized.

[Series 1: run · 4 of 121 frames shown]
[frame 19/121]
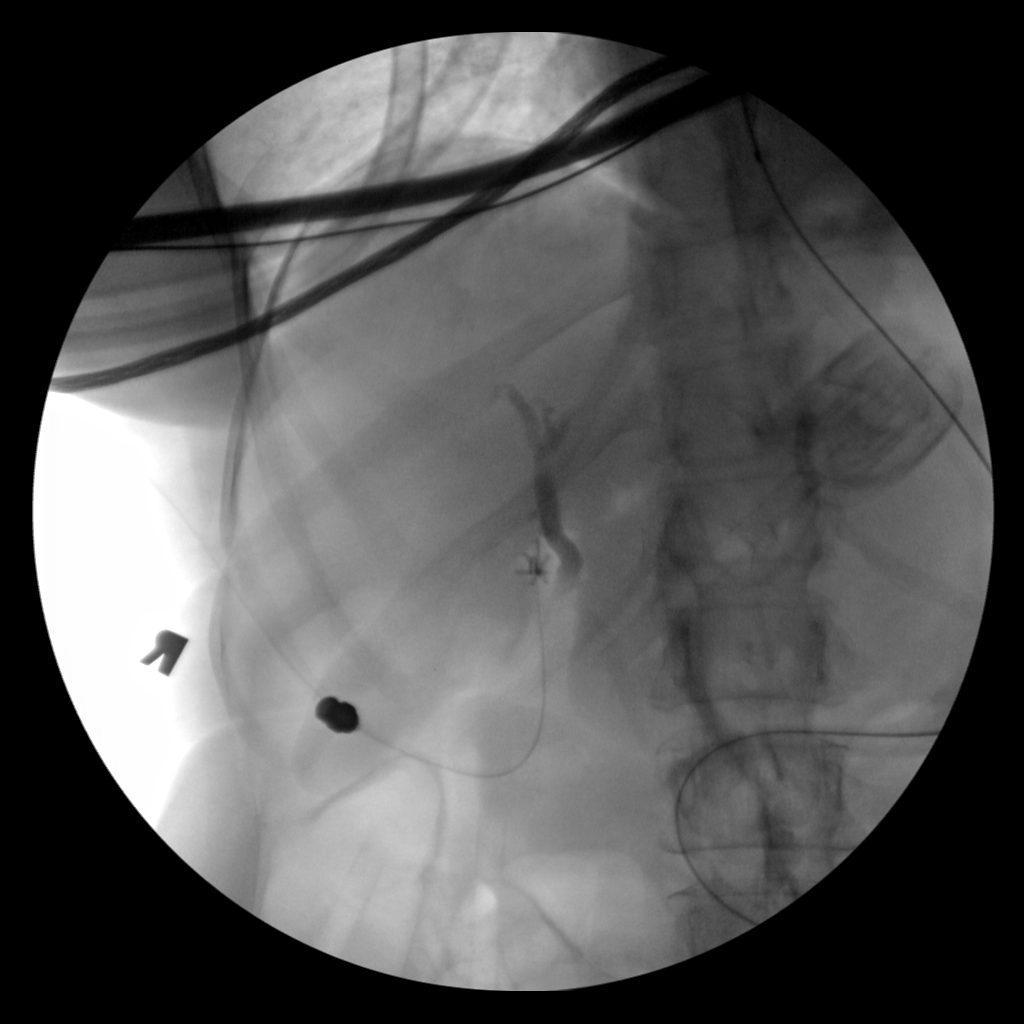
[frame 61/121]
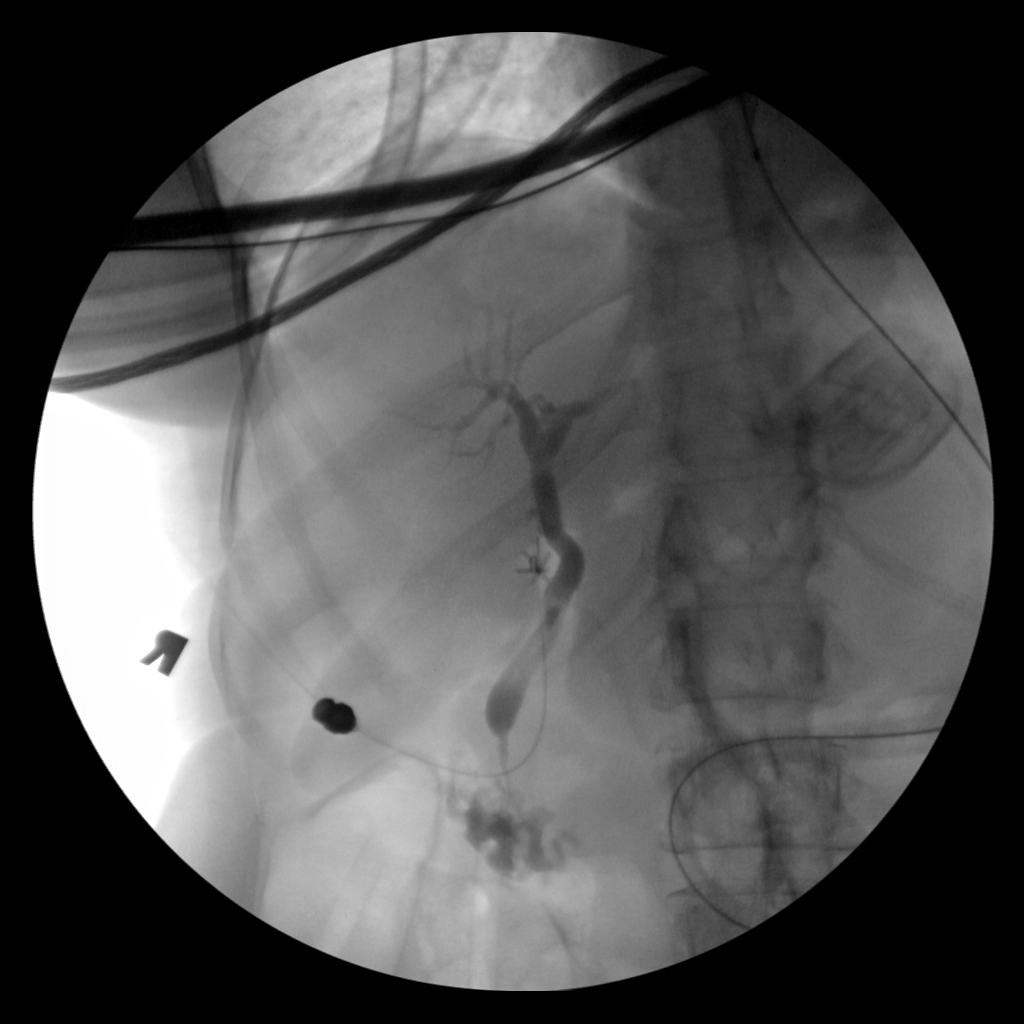
[frame 103/121]
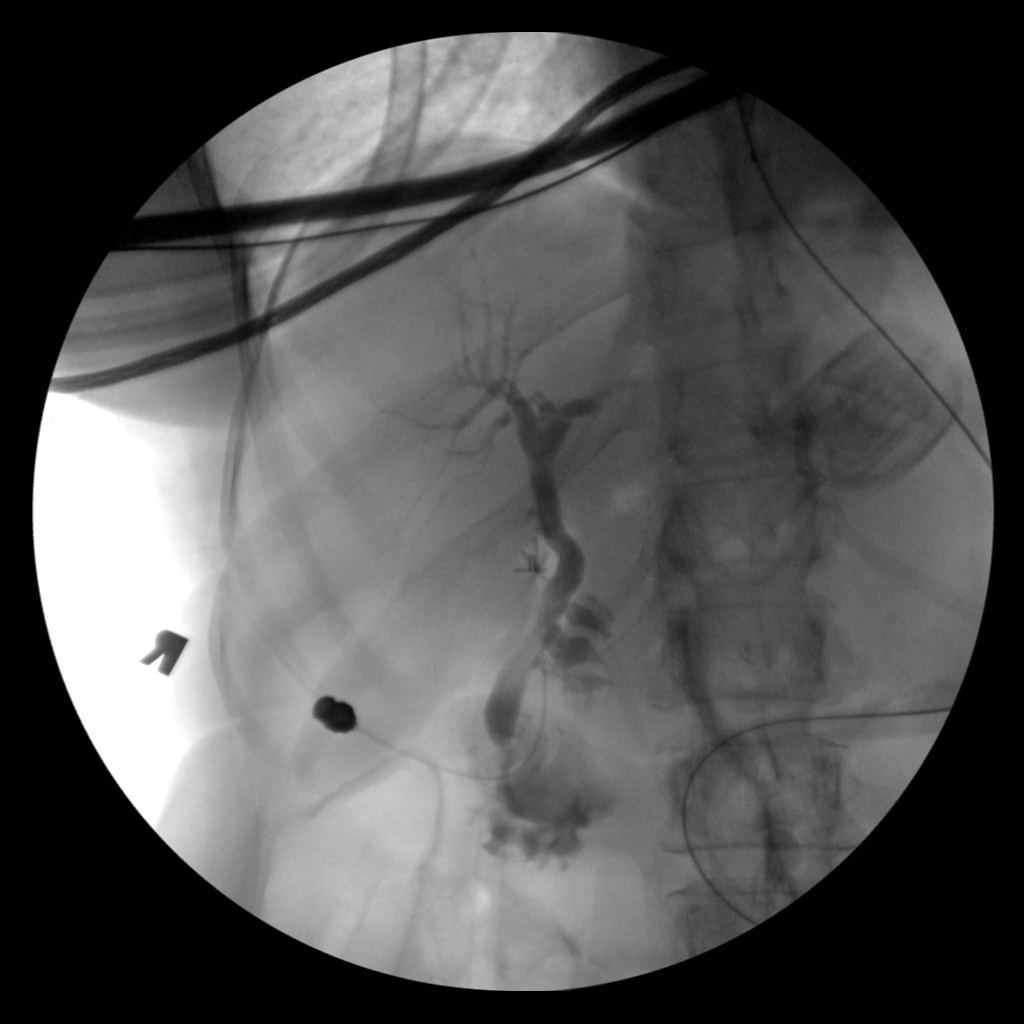
[frame 121/121]
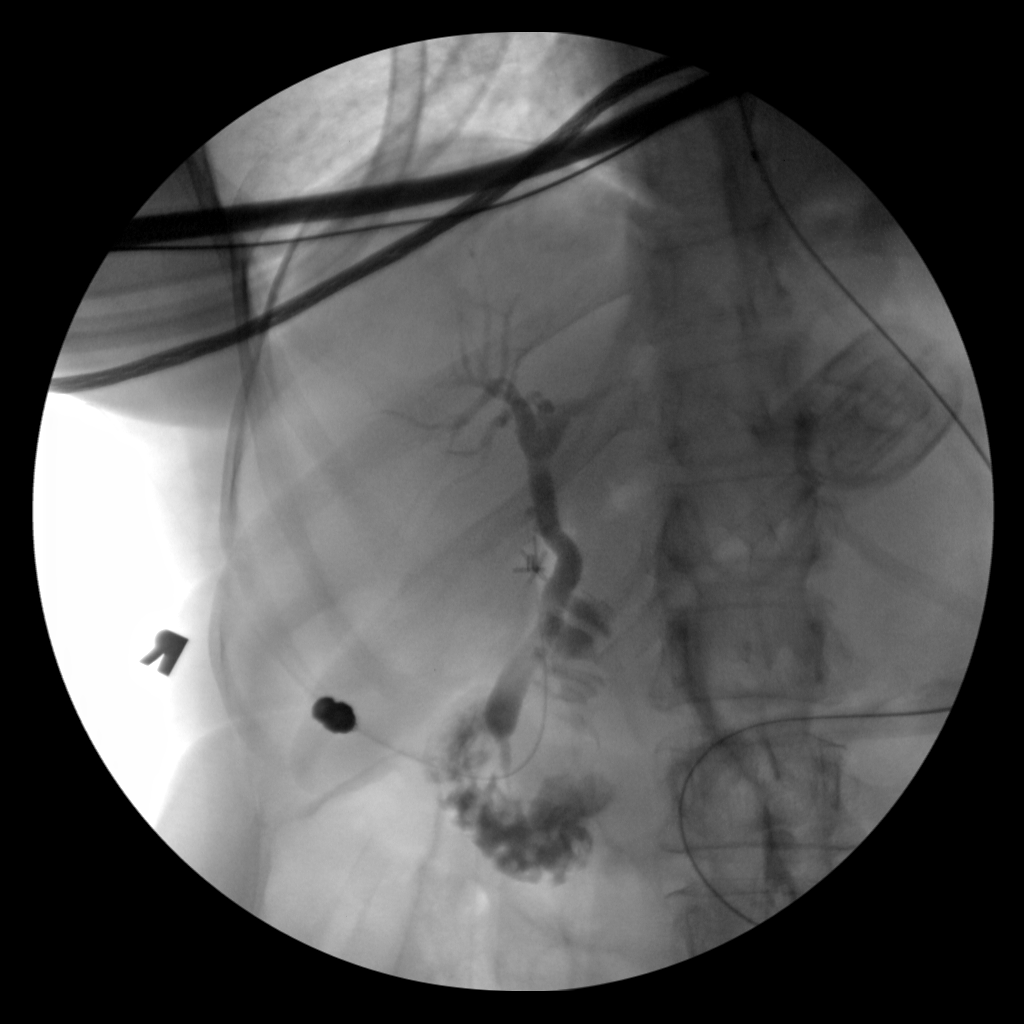

[4 of 4 positions shown; findings below may reference images not displayed]

FINDINGS: Intraoperative cholangiogram demonstrates a normal opacified biliary
tree without evidence of filling defect or biliary obstruction.
Contrast enters the duodenum normally. Visualized intrahepatic ducts
are unremarkable. No contrast extravasation is seen.
IMPRESSION: Normal intraoperative cholangiogram.

## 2015-10-16 ENCOUNTER — Other Ambulatory Visit: Payer: Self-pay

## 2015-10-16 MED ORDER — ALENDRONATE SODIUM 70 MG PO TABS: 70.0000 mg | ORAL_TABLET | ORAL | Status: DC

## 2015-12-14 ENCOUNTER — Encounter: Payer: Self-pay | Admitting: Gynecology

## 2015-12-27 ENCOUNTER — Other Ambulatory Visit: Payer: Self-pay | Admitting: Gynecology

## 2015-12-27 DIAGNOSIS — M810 Age-related osteoporosis without current pathological fracture: Secondary | ICD-10-CM

## 2016-04-21 ENCOUNTER — Other Ambulatory Visit: Payer: Self-pay | Admitting: Gynecology

## 2016-04-21 MED ORDER — ALENDRONATE SODIUM 70 MG PO TABS: ORAL_TABLET | ORAL | 0 refills | Status: DC

## 2016-04-23 ENCOUNTER — Other Ambulatory Visit: Payer: Self-pay

## 2016-04-23 MED ORDER — ALENDRONATE SODIUM 70 MG PO TABS: ORAL_TABLET | ORAL | 0 refills | Status: DC

## 2016-04-28 ENCOUNTER — Encounter: Payer: Self-pay | Admitting: Gynecology

## 2016-04-28 ENCOUNTER — Ambulatory Visit (INDEPENDENT_AMBULATORY_CARE_PROVIDER_SITE_OTHER): Payer: Medicare Other | Admitting: Gynecology

## 2016-04-28 VITALS — BP 138/70 | Ht 61.0 in | Wt 112.0 lb

## 2016-04-28 DIAGNOSIS — D071 Carcinoma in situ of vulva: Secondary | ICD-10-CM | POA: Diagnosis not present

## 2016-04-28 DIAGNOSIS — M818 Other osteoporosis without current pathological fracture: Secondary | ICD-10-CM

## 2016-04-28 DIAGNOSIS — Z01411 Encounter for gynecological examination (general) (routine) with abnormal findings: Secondary | ICD-10-CM | POA: Diagnosis not present

## 2016-04-28 NOTE — Progress Notes (Signed)
Linda Barber 09-21-1940 [id number]   History:    75 y.o.  for annual gyn exam with no complaints today. Patient was seen in the office in January this year to discuss her bone density study which was compared with 2015 and had demonstrated the following:  Lowest T score was now -2.6 at the AP spine with a statistically significant decrease in bone mineralization of -6.3%. Left femoral neck T score -1.8 with statistically significant decrease of bone mineralization of -10.5%. Right femoral neck no statistically significant change.  Patient had a calcium and vitamin D level done at her PCP office recently which was normal we did a PTH level here in the office which was also normal. Based on patient's bone density her bone mass is 25% below normal and she has a 5 times greater risk of a spinal fracture and a 7 times greater risk of a hip fracture. We discussed different treatment options to include the following: Prolia every 6 months injection, Reclast yearly IV infusion, Actonel monthly tablet, Fosamax weekly tablet. Patient decided to proceed with Fosamax 70 mg every weekly which she has been tolerating well.   Patient with past history of total abdominal hysterectomy with bilateral salpingo-oophorectomy. in 1995 for uterine leiomyoma. Patient also has a history of vulvar carcinoma in situ excised in 2008 with no residual or evidence of recurrence reported. Patient's primary care physician is Dr. Iona Beard who has been doing patient's lab work. Patient had a colonoscopy in 2014 which was reported to be normal. Patient also has had a history of hemorrhoidectomy. Her PCP has been doing her blood work and she states all her vaccines are up-to-date. She states her recent vitamin D level was normal. Patient with no previous history of any abnormal Pap smear.   Past medical history,surgical history, family history and social history were all reviewed and documented in the EPIC chart.  Gynecologic  History No LMP recorded. Patient has had a hysterectomy. Contraception: post menopausal status Last Pap: 2015. Results were: normal Last mammogram: 2017. Results were: normal  Obstetric History OB History  Gravida Para Term Preterm AB Living  2 2       2   SAB TAB Ectopic Multiple Live Births               # Outcome Date GA Lbr Len/2nd Weight Sex Delivery Anes PTL Lv  2 Para           1 Para                ROS: A ROS was performed and pertinent positives and negatives are included in the history.  GENERAL: No fevers or chills. HEENT: No change in vision, no earache, sore throat or sinus congestion. NECK: No pain or stiffness. CARDIOVASCULAR: No chest pain or pressure. No palpitations. PULMONARY: No shortness of breath, cough or wheeze. GASTROINTESTINAL: No abdominal pain, nausea, vomiting or diarrhea, melena or bright red blood per rectum. GENITOURINARY: No urinary frequency, urgency, hesitancy or dysuria. MUSCULOSKELETAL: No joint or muscle pain, no back pain, no recent trauma. DERMATOLOGIC: No rash, no itching, no lesions. ENDOCRINE: No polyuria, polydipsia, no heat or cold intolerance. No recent change in weight. HEMATOLOGICAL: No anemia or easy bruising or bleeding. NEUROLOGIC: No headache, seizures, numbness, tingling or weakness. PSYCHIATRIC: No depression, no loss of interest in normal activity or change in sleep pattern.     Exam: chaperone present  BP 138/70   Ht 5\' 1"  (1.549 m)  Wt 112 lb (50.8 kg)   BMI 21.16 kg/m   Body mass index is 21.16 kg/m.  General appearance : Well developed well nourished female. No acute distress HEENT: Eyes: no retinal hemorrhage or exudates,  Neck supple, trachea midline, no carotid bruits, no thyroidmegaly Lungs: Clear to auscultation, no rhonchi or wheezes, or rib retractions  Heart: Regular rate and rhythm, no murmurs or gallops Breast:Examined in sitting and supine position were symmetrical in appearance, no palpable masses or  tenderness,  no skin retraction, no nipple inversion, no nipple discharge, no skin discoloration, no axillary or supraclavicular lymphadenopathy Abdomen: no palpable masses or tenderness, no rebound or guarding Extremities: no edema or skin discoloration or tenderness  Pelvic:  Bartholin, Urethra, Skene Glands: Within normal limits             Vagina: No gross lesions or discharge, atrophic changes  Cervix: Absent  Uterus absent  Adnexa  Without masses or tenderness  Anus and perineum  normal   Rectovaginal  normal sphincter tone without palpated masses or tenderness             Hemoccult PCP provides     Assessment/Plan:  75 y.o. female for annual exam with history of osteoporosis we'll complete 12 months after initiating Fosamax 70 mg every weekly. Patient will return to the office next month for 1 year follow-up bone density study to monitor response to therapy. Patient is taking her calcium and vitamin D and is very active lifestyle. She stated recently her PCP did her vitamin D level along with her routine lab work and was normal. Patient with past history VIN III will return in 6 months for follow-up colposcopy.   Terrance Mass MD, 2:29 PM 04/28/2016

## 2016-05-27 ENCOUNTER — Ambulatory Visit (INDEPENDENT_AMBULATORY_CARE_PROVIDER_SITE_OTHER): Payer: Medicare Other

## 2016-05-27 DIAGNOSIS — M810 Age-related osteoporosis without current pathological fracture: Secondary | ICD-10-CM

## 2016-07-18 ENCOUNTER — Other Ambulatory Visit: Payer: Self-pay | Admitting: Gynecology

## 2016-09-29 ENCOUNTER — Other Ambulatory Visit: Payer: Self-pay | Admitting: Gynecology

## 2016-10-01 ENCOUNTER — Encounter: Payer: Self-pay | Admitting: Gynecology

## 2016-10-03 ENCOUNTER — Other Ambulatory Visit: Payer: Self-pay | Admitting: Gynecology

## 2016-10-22 ENCOUNTER — Encounter: Payer: Self-pay | Admitting: Gynecology

## 2016-10-22 ENCOUNTER — Ambulatory Visit (INDEPENDENT_AMBULATORY_CARE_PROVIDER_SITE_OTHER): Payer: Medicare Other | Admitting: Gynecology

## 2016-10-22 VITALS — BP 98/70 | Ht 61.0 in | Wt 114.0 lb

## 2016-10-22 DIAGNOSIS — D071 Carcinoma in situ of vulva: Secondary | ICD-10-CM

## 2016-10-22 NOTE — Patient Instructions (Signed)

## 2016-10-22 NOTE — Progress Notes (Signed)
  Patient is a 76 year old that presented to the office for her annual colposcopic evaluation because of her past history of vulvar carcinoma in situ treated via wide local excision by another provider several years ago. Patient also has had a history of total abdominal hysterectomy with bilateral salpingo-oophorectomy in 1995 as a result of symptomatic leiomyomatous uteri. Patient's last Pap smear 2015 was normal. Patient  Patient underwent a detail colposcopic evaluation external genitalia, perineum and perirectal region with no lesions noted. The speculum was introduced into the vagina is systematic inspection of the vagina and vaginal cuff demonstrated no abnormalities.  Assessment/plan: Patient with past history of vulvar carcinoma in situ with wide local excision negative margins reported. Negative colposcopy today. Pap smear was done today last Pap smear normal in 2015. Patient otherwise scheduled to return to the office in December this year for annual exam.

## 2016-10-23 LAB — PAP IG W/ RFLX HPV ASCU

## 2016-12-01 ENCOUNTER — Encounter: Payer: Self-pay | Admitting: Gynecology

## 2017-03-17 ENCOUNTER — Other Ambulatory Visit: Payer: Self-pay

## 2017-03-17 MED ORDER — ALENDRONATE SODIUM 70 MG PO TABS: ORAL_TABLET | ORAL | 0 refills | Status: DC

## 2017-03-17 NOTE — Telephone Encounter (Signed)
Has CE scheduled 04/29/17 with Dr. Dellis Filbert.

## 2017-04-29 ENCOUNTER — Encounter: Payer: Self-pay | Admitting: Obstetrics & Gynecology

## 2017-04-29 ENCOUNTER — Ambulatory Visit (INDEPENDENT_AMBULATORY_CARE_PROVIDER_SITE_OTHER): Payer: Medicare Other | Admitting: Obstetrics & Gynecology

## 2017-04-29 VITALS — BP 118/78 | Ht 61.0 in | Wt 115.0 lb

## 2017-04-29 DIAGNOSIS — Z90710 Acquired absence of both cervix and uterus: Secondary | ICD-10-CM

## 2017-04-29 DIAGNOSIS — Z01411 Encounter for gynecological examination (general) (routine) with abnormal findings: Secondary | ICD-10-CM | POA: Diagnosis not present

## 2017-04-29 DIAGNOSIS — D071 Carcinoma in situ of vulva: Secondary | ICD-10-CM | POA: Diagnosis not present

## 2017-04-29 DIAGNOSIS — M8589 Other specified disorders of bone density and structure, multiple sites: Secondary | ICD-10-CM | POA: Diagnosis not present

## 2017-04-29 DIAGNOSIS — Z9079 Acquired absence of other genital organ(s): Secondary | ICD-10-CM

## 2017-04-29 DIAGNOSIS — Z90722 Acquired absence of ovaries, bilateral: Secondary | ICD-10-CM

## 2017-04-29 NOTE — Progress Notes (Signed)
Linda Barber Aug 17, 1940 [id number]   History:    76 y.o. G2P2L2  RP:  Established patient presenting for annual gyn exam   HPI: Menopause, well on no hormone replacement therapy.  Status post hysterectomy.  Remote history of VIN 3.  Had wide vulvar excision with negative margins.  Negative vulvar colposcopy with Dr. Toney Rakes in June 2018.  A Pap test was done at that time as well.  Urine and bowel movements normal.  Osteopenia on last bone density.  Taking vitamin D supplements and calcium rich nutrition.  Breasts normal.  Health labs with family physician.  Past medical history,surgical history, family history and social history were all reviewed and documented in the EPIC chart.  Gynecologic History No LMP recorded. Patient has had a hysterectomy. Contraception: status post hysterectomy Last Pap: 10/2016. Results were: normal Last mammogram: 11/2016. Results were: normal Bone density 05/2016 [facility] 2014  Obstetric History OB History  Gravida Para Term Preterm AB Living  2 2       2   SAB TAB Ectopic Multiple Live Births               # Outcome Date GA Lbr Len/2nd Weight Sex Delivery Anes PTL Lv  2 Para           1 Para                ROS: A ROS was performed and pertinent positives and negatives are included in the history.  GENERAL: No fevers or chills. HEENT: No change in vision, no earache, sore throat or sinus congestion. NECK: No pain or stiffness. CARDIOVASCULAR: No chest pain or pressure. No palpitations. PULMONARY: No shortness of breath, cough or wheeze. GASTROINTESTINAL: No abdominal pain, nausea, vomiting or diarrhea, melena or bright red blood per rectum. GENITOURINARY: No urinary frequency, urgency, hesitancy or dysuria. MUSCULOSKELETAL: No joint or muscle pain, no back pain, no recent trauma. DERMATOLOGIC: No rash, no itching, no lesions. ENDOCRINE: No polyuria, polydipsia, no heat or cold intolerance. No recent change in weight. HEMATOLOGICAL: No anemia or easy  bruising or bleeding. NEUROLOGIC: No headache, seizures, numbness, tingling or weakness. PSYCHIATRIC: No depression, no loss of interest in normal activity or change in sleep pattern.     Exam:   BP 118/78 (BP Location: Right Arm, Patient Position: Sitting, Cuff Size: Normal)   Ht 5\' 1"  (1.549 m)   Wt 115 lb (52.2 kg)   BMI 21.73 kg/m   Body mass index is 21.73 kg/m.  General appearance : Well developed well nourished female. No acute distress HEENT: Eyes: no retinal hemorrhage or exudates,  Neck supple, trachea midline, no carotid bruits, no thyroidmegaly Lungs: Clear to auscultation, no rhonchi or wheezes, or rib retractions  Heart: Regular rate and rhythm, no murmurs or gallops Breast:Examined in sitting and supine position were symmetrical in appearance, no palpable masses or tenderness,  no skin retraction, no nipple inversion, no nipple discharge, no skin discoloration, no axillary or supraclavicular lymphadenopathy Abdomen: no palpable masses or tenderness, no rebound or guarding Extremities: no edema or skin discoloration or tenderness  Pelvic: Vulva Rt scar from previous wide excision for VIN 3.  No lesion seen.  Lt normal.  Bartholin, Urethra, Skene Glands: Within normal limits             Vagina: No gross lesions or discharge  Cervix/Uterus absent  Adnexa  Without masses or tenderness  Anus and perineum  normal    Assessment/Plan:  76 y.o. female for  annual exam   1. Encounter for gynecological examination with abnormal finding Gynecologic exam status post total abdominal hysterectomy with bilateral salpingo-oophorectomy.  Pap test negative in June 2018.  Breast exam normal.  Screening mammography normal in July 2018.  Colonoscopy 2014.  Health labs with family physician.  2. S/P TAH-BSO Surgical menopause well on no hormone replacement therapy.  3. Osteopenia of multiple sites Osteopenia at multiple sites on bone density in January 2018.  Vitamin D supplements,  calcium rich nutrition and weightbearing physical activity recommended.  4. Vulvar intraepithelial neoplasia (VIN) grade 3 History of vulvar intraepithelial neoplasia grade 3.  Colposcopy of the vulva negative October 22, 2016.  Counseling on above issues x more than 50% for 15 minutes.  [name] MD, 2:15 PM 04/29/2017

## 2017-05-03 ENCOUNTER — Encounter: Payer: Self-pay | Admitting: Obstetrics & Gynecology

## 2017-05-03 NOTE — Patient Instructions (Signed)
1. Encounter for gynecological examination with abnormal finding Gynecologic exam status post total abdominal hysterectomy with bilateral salpingo-oophorectomy.  Pap test negative in June 2018.  Breast exam normal.  Screening mammography normal in July 2018.  Colonoscopy 2014.  Health labs with family physician.  2. S/P TAH-BSO Surgical menopause well on no hormone replacement therapy.  3. Osteopenia of multiple sites Osteopenia at multiple sites on bone density in January 2018.  Vitamin D supplements, calcium rich nutrition and weightbearing physical activity recommended.  4. Vulvar intraepithelial neoplasia (VIN) grade 3 History of vulvar intraepithelial neoplasia grade 3.  Colposcopy of the vulva negative October 22, 2016.  Linda Barber, it was a pleasure meeting you today!   Health Maintenance for Postmenopausal Women Menopause is a normal process in which your reproductive ability comes to an end. This process happens gradually over a span of months to years, usually between the ages of 59 and 52. Menopause is complete when you have missed 12 consecutive menstrual periods. It is important to talk with your health care provider about some of the most common conditions that affect postmenopausal women, such as heart disease, cancer, and bone loss (osteoporosis). Adopting a healthy lifestyle and getting preventive care can help to promote your health and wellness. Those actions can also lower your chances of developing some of these common conditions. What should I know about menopause? During menopause, you may experience a number of symptoms, such as:  Moderate-to-severe hot flashes.  Night sweats.  Decrease in sex drive.  Mood swings.  Headaches.  Tiredness.  Irritability.  Memory problems.  Insomnia.  Choosing to treat or not to treat menopausal changes is an individual decision that you make with your health care provider. What should I know about hormone replacement therapy and  supplements? Hormone therapy products are effective for treating symptoms that are associated with menopause, such as hot flashes and night sweats. Hormone replacement carries certain risks, especially as you become older. If you are thinking about using estrogen or estrogen with progestin treatments, discuss the benefits and risks with your health care provider. What should I know about heart disease and stroke? Heart disease, heart attack, and stroke become more likely as you age. This may be due, in part, to the hormonal changes that your body experiences during menopause. These can affect how your body processes dietary fats, triglycerides, and cholesterol. Heart attack and stroke are both medical emergencies. There are many things that you can do to help prevent heart disease and stroke:  Have your blood pressure checked at least every 1-2 years. High blood pressure causes heart disease and increases the risk of stroke.  If you are 70-47 years old, ask your health care provider if you should take aspirin to prevent a heart attack or a stroke.  Do not use any tobacco products, including cigarettes, chewing tobacco, or electronic cigarettes. If you need help quitting, ask your health care provider.  It is important to eat a healthy diet and maintain a healthy weight. ? Be sure to include plenty of vegetables, fruits, low-fat dairy products, and lean protein. ? Avoid eating foods that are high in solid fats, added sugars, or salt (sodium).  Get regular exercise. This is one of the most important things that you can do for your health. ? Try to exercise for at least 150 minutes each week. The type of exercise that you do should increase your heart rate and make you sweat. This is known as moderate-intensity exercise. ? Try to  do strengthening exercises at least twice each week. Do these in addition to the moderate-intensity exercise.  Know your numbers.Ask your health care provider to check  your cholesterol and your blood glucose. Continue to have your blood tested as directed by your health care provider.  What should I know about cancer screening? There are several types of cancer. Take the following steps to reduce your risk and to catch any cancer development as early as possible. Breast Cancer  Practice breast self-awareness. ? This means understanding how your breasts normally appear and feel. ? It also means doing regular breast self-exams. Let your health care provider know about any changes, no matter how small.  If you are 54 or older, have a clinician do a breast exam (clinical breast exam or CBE) every year. Depending on your age, family history, and medical history, it may be recommended that you also have a yearly breast X-ray (mammogram).  If you have a family history of breast cancer, talk with your health care provider about genetic screening.  If you are at high risk for breast cancer, talk with your health care provider about having an MRI and a mammogram every year.  Breast cancer (BRCA) gene test is recommended for women who have family members with BRCA-related cancers. Results of the assessment will determine the need for genetic counseling and BRCA1 and for BRCA2 testing. BRCA-related cancers include these types: ? Breast. This occurs in males or females. ? Ovarian. ? Tubal. This may also be called fallopian tube cancer. ? Cancer of the abdominal or pelvic lining (peritoneal cancer). ? Prostate. ? Pancreatic.  Cervical, Uterine, and Ovarian Cancer Your health care provider may recommend that you be screened regularly for cancer of the pelvic organs. These include your ovaries, uterus, and vagina. This screening involves a pelvic exam, which includes checking for microscopic changes to the surface of your cervix (Pap test).  For women ages 21-65, health care providers may recommend a pelvic exam and a Pap test every three years. For women ages 28-65,  they may recommend the Pap test and pelvic exam, combined with testing for human papilloma virus (HPV), every five years. Some types of HPV increase your risk of cervical cancer. Testing for HPV may also be done on women of any age who have unclear Pap test results.  Other health care providers may not recommend any screening for nonpregnant women who are considered low risk for pelvic cancer and have no symptoms. Ask your health care provider if a screening pelvic exam is right for you.  If you have had past treatment for cervical cancer or a condition that could lead to cancer, you need Pap tests and screening for cancer for at least 20 years after your treatment. If Pap tests have been discontinued for you, your risk factors (such as having a new sexual partner) need to be reassessed to determine if you should start having screenings again. Some women have medical problems that increase the chance of getting cervical cancer. In these cases, your health care provider may recommend that you have screening and Pap tests more often.  If you have a family history of uterine cancer or ovarian cancer, talk with your health care provider about genetic screening.  If you have vaginal bleeding after reaching menopause, tell your health care provider.  There are currently no reliable tests available to screen for ovarian cancer.  Lung Cancer Lung cancer screening is recommended for adults 75-52 years old who are at high  risk for lung cancer because of a history of smoking. A yearly low-dose CT scan of the lungs is recommended if you:  Currently smoke.  Have a history of at least 30 pack-years of smoking and you currently smoke or have quit within the past 15 years. A pack-year is smoking an average of one pack of cigarettes per day for one year.  Yearly screening should:  Continue until it has been 15 years since you quit.  Stop if you develop a health problem that would prevent you from having lung  cancer treatment.  Colorectal Cancer  This type of cancer can be detected and can often be prevented.  Routine colorectal cancer screening usually begins at age 50 and continues through age 46.  If you have risk factors for colon cancer, your health care provider may recommend that you be screened at an earlier age.  If you have a family history of colorectal cancer, talk with your health care provider about genetic screening.  Your health care provider may also recommend using home test kits to check for hidden blood in your stool.  A small camera at the end of a tube can be used to examine your colon directly (sigmoidoscopy or colonoscopy). This is done to check for the earliest forms of colorectal cancer.  Direct examination of the colon should be repeated every 5-10 years until age 33. However, if early forms of precancerous polyps or small growths are found or if you have a family history or genetic risk for colorectal cancer, you may need to be screened more often.  Skin Cancer  Check your skin from head to toe regularly.  Monitor any moles. Be sure to tell your health care provider: ? About any new moles or changes in moles, especially if there is a change in a mole's shape or color. ? If you have a mole that is larger than the size of a pencil eraser.  If any of your family members has a history of skin cancer, especially at a young age, talk with your health care provider about genetic screening.  Always use sunscreen. Apply sunscreen liberally and repeatedly throughout the day.  Whenever you are outside, protect yourself by wearing long sleeves, pants, a wide-brimmed hat, and sunglasses.  What should I know about osteoporosis? Osteoporosis is a condition in which bone destruction happens more quickly than new bone creation. After menopause, you may be at an increased risk for osteoporosis. To help prevent osteoporosis or the bone fractures that can happen because of  osteoporosis, the following is recommended:  If you are 82-33 years old, get at least 1,000 mg of calcium and at least 600 mg of vitamin D per day.  If you are older than age 23 but younger than age 38, get at least 1,200 mg of calcium and at least 600 mg of vitamin D per day.  If you are older than age 81, get at least 1,200 mg of calcium and at least 800 mg of vitamin D per day.  Smoking and excessive alcohol intake increase the risk of osteoporosis. Eat foods that are rich in calcium and vitamin D, and do weight-bearing exercises several times each week as directed by your health care provider. What should I know about how menopause affects my mental health? Depression may occur at any age, but it is more common as you become older. Common symptoms of depression include:  Low or sad mood.  Changes in sleep patterns.  Changes in appetite  or eating patterns.  Feeling an overall lack of motivation or enjoyment of activities that you previously enjoyed.  Frequent crying spells.  Talk with your health care provider if you think that you are experiencing depression. What should I know about immunizations? It is important that you get and maintain your immunizations. These include:  Tetanus, diphtheria, and pertussis (Tdap) booster vaccine.  Influenza every year before the flu season begins.  Pneumonia vaccine.  Shingles vaccine.  Your health care provider may also recommend other immunizations. This information is not intended to replace advice given to you by your health care provider. Make sure you discuss any questions you have with your health care provider. Document Released: 06/27/2005 Document Revised: 11/23/2015 Document Reviewed: 02/06/2015 Elsevier Interactive Patient Education  2018 [employer].

## 2017-06-10 ENCOUNTER — Other Ambulatory Visit: Payer: Self-pay | Admitting: Obstetrics & Gynecology

## 2017-08-04 ENCOUNTER — Encounter: Payer: Self-pay | Admitting: Obstetrics & Gynecology

## 2017-08-04 ENCOUNTER — Ambulatory Visit: Payer: Medicare Other | Admitting: Obstetrics & Gynecology

## 2017-08-04 VITALS — BP 136/88 | Ht 60.5 in | Wt 114.0 lb

## 2017-08-04 DIAGNOSIS — D071 Carcinoma in situ of vulva: Secondary | ICD-10-CM

## 2017-08-04 DIAGNOSIS — N766 Ulceration of vulva: Secondary | ICD-10-CM

## 2017-08-04 NOTE — Progress Notes (Signed)
    Linda Barber [birth year] [id number]        76 y.o.  G2P2   RP: Left vulvar irritation with a rash x 2 days  HPI: Painful rash on left vulva at the back for 2 days.  No episode like that in the past.  No genital or labial herpes in the past per patient.   OB History  Gravida Para Term Preterm AB Living  2 2       2   SAB TAB Ectopic Multiple Live Births               # Outcome Date GA Lbr Len/2nd Weight Sex Delivery Anes PTL Lv  2 Para           1 Para               Past medical history,surgical history, problem list, medications, allergies, family history and social history were all reviewed and documented in the EPIC chart.   Directed ROS with pertinent positives and negatives documented in the history of present illness/assessment and plan.  Exam:  Vitals:   08/04/17 1556  BP: 136/88  Weight: 114 lb (51.7 kg)  Height: 5' 0.5" (1.537 m)   General appearance:  Normal   Gynecologic exam: Left mid vulva with a few small ulcers.  HSV Sureswab done.   Assessment/Plan:  77 y.o. G2P2   1. Vulvar ulcer Pending SureSwab HSV, treatment per results.  Valacyclovir prophylaxis vs treatment - SureSwab HSV, Type 1/2 DNA, PCR  2. VIN III (vulvar intraepithelial neoplasia III) Remote history of VIN 3 with negative colposcopy of the vulva in June 2018 with Dr. Toney Rakes.  Management discussed with patient and she prefers not repeating the vulvar colposcopies at this point, but rather doing a thorough vulvar exam at next annual gynecologic exam, which will be in December 2019.  Counseling and coordination of care more than 50% for 15 minutes.  [name] MD, 4:16 PM 08/04/2017

## 2017-08-06 LAB — SURESWAB HSV, TYPE 1/2 DNA, PCR
HSV 1 DNA: NOT DETECTED
HSV 2 DNA: DETECTED — AB

## 2017-08-09 ENCOUNTER — Encounter: Payer: Self-pay | Admitting: Obstetrics & Gynecology

## 2017-08-09 NOTE — Patient Instructions (Signed)
1. Vulvar ulcer Pending SureSwab HSV, treatment per results.  Valacyclovir prophylaxis vs treatment - SureSwab HSV, Type 1/2 DNA, PCR  2. VIN III (vulvar intraepithelial neoplasia III) Remote history of VIN 3 with negative colposcopy of the vulva in June 2018 with Dr. Toney Rakes.  Management discussed with patient and she prefers not repeating the vulvar colposcopies at this point, but rather doing a thorough vulvar exam at next annual gynecologic exam, which will be in December 2019.  Linda Barber, good seeing you today!  I will inform you of your results as soon as they are available.   Genital Herpes Genital herpes is a common sexually transmitted infection (STI) that is caused by a virus. The virus spreads from person to person through sexual contact. Infection can cause itching, blisters, and sores around the genitals or rectum. Symptoms may last several days and then go away This is called an outbreak. However, the virus remains in your body, so you may have more outbreaks in the future. The time between outbreaks varies and can be months or years. Genital herpes affects men and women. It is particularly concerning for pregnant women because the virus can be passed to the baby during delivery and can cause serious problems. Genital herpes is also a concern for people who have a weak disease-fighting (immune) system. What are the causes? This condition is caused by the herpes simplex virus (HSV) type 1 or type 2. The virus may spread through:  Sexual contact with an infected person, including vaginal, anal, and oral sex.  Contact with fluid from a herpes sore.  The skin. This means that you can get herpes from an infected partner even if he or she does not have a visible sore or does not know that he or she is infected.  What increases the risk? You are more likely to develop this condition if:  You have sex with many partners.  You do not use latex condoms during sex.  What are the signs  or symptoms? Most people do not have symptoms (asymptomatic) or have mild symptoms that may be mistaken for other skin problems. Symptoms may include:  Small red bumps near the genitals, rectum, or mouth. These bumps turn into blisters and then turn into sores.  Flu-like symptoms, including: ? Fever. ? Body aches. ? Swollen lymph nodes. ? Headache.  Painful urination.  Pain and itching in the genital area or rectal area.  Vaginal discharge.  Tingling or shooting pain in the legs and buttocks.  Generally, symptoms are more severe and last longer during the first (primary) outbreak. Flu-like symptoms are also more common during the primary outbreak. How is this diagnosed? Genital herpes may be diagnosed based on:  A physical exam.  Your medical history.  Blood tests.  A test of a fluid sample (culture) from an open sore.  How is this treated? There is no cure for this condition, but treatment with antiviral medicines that are taken by mouth (orally) can do the following:  Speed up healing and relieve symptoms.  Help to reduce the spread of the virus to sexual partners.  Limit the chance of future outbreaks, or make future outbreaks shorter.  Lessen symptoms of future outbreaks.  Your health care provider may also recommend pain relief medicines, such as aspirin or ibuprofen. Follow these instructions at home: Sexual activity  Do not have sexual contact during active outbreaks.  Practice safe sex. Latex condoms and female condoms may help prevent the spread of the herpes virus.  General instructions  Keep the affected areas dry and clean.  Take over-the-counter and prescription medicines only as told by your health care provider.  Avoid rubbing or touching blisters and sores. If you do touch blisters or sores: ? Wash your hands thoroughly with soap and water. ? Do not touch your eyes afterward.  To help relieve pain or itching, you may take the following actions  as directed by your health care provider: ? Apply a cold, wet cloth (cold compress) to affected areas 4-6 times a day. ? Apply a substance that protects your skin and reduces bleeding (astringent). ? Apply a gel that helps relieve pain around sores (lidocaine gel). ? Take a warm, shallow bath that cleans the genital area (sitz bath).  Keep all follow-up visits as told by your health care provider. This is important. How is this prevented?  Use condoms. Although anyone can get genital herpes during sexual contact, even with the use of a condom, a condom can provide some protection.  Avoid having multiple sexual partners.  Talk with your sexual partner about any symptoms either of you may have. Also, talk with your partner about any history of STIs.  Get tested for STIs before you have sex. Ask your partner to do the same.  Do not have sexual contact if you have symptoms of genital herpes. Contact a health care provider if:  Your symptoms are not improving with medicine.  Your symptoms return.  You have new symptoms.  You have a fever.  You have abdominal pain.  You have redness, swelling, or pain in your eye.  You notice new sores on other parts of your body.  You are a woman and experience bleeding between menstrual periods.  You have had herpes and you become pregnant or plan to become pregnant. Summary  Genital herpes is a common sexually transmitted infection (STI) that is caused by the herpes simplex virus (HSV) type 1 or type 2.  These viruses are most often spread through sexual contact with an infected person.  You are more likely to develop this condition if you have sex with many partners or you have unprotected sex.  Most people do not have symptoms (asymptomatic) or have mild symptoms that may be mistaken for other skin problems. Symptoms occur as outbreaks that may happen months or years apart.  There is no cure for this condition, but treatment with oral  antiviral medicines can reduce symptoms, reduce the chance of spreading the virus to a partner, prevent future outbreaks, or shorten future outbreaks. This information is not intended to replace advice given to you by your health care provider. Make sure you discuss any questions you have with your health care provider. Document Released: 05/02/2000 Document Revised: 04/04/2016 Document Reviewed: 04/04/2016 Elsevier Interactive Patient Education  [employer].

## 2017-08-10 ENCOUNTER — Other Ambulatory Visit: Payer: Self-pay | Admitting: Obstetrics & Gynecology

## 2017-08-10 MED ORDER — VALACYCLOVIR HCL 1 G PO TABS: 1000.0000 mg | ORAL_TABLET | Freq: Every day | ORAL | 3 refills | Status: DC

## 2017-09-04 ENCOUNTER — Other Ambulatory Visit: Payer: Self-pay | Admitting: Obstetrics & Gynecology

## 2017-10-27 ENCOUNTER — Ambulatory Visit: Payer: Medicare Other | Admitting: Obstetrics & Gynecology

## 2017-12-03 ENCOUNTER — Encounter: Payer: Self-pay | Admitting: Anesthesiology

## 2018-02-22 ENCOUNTER — Other Ambulatory Visit: Payer: Self-pay | Admitting: Obstetrics & Gynecology

## 2018-04-30 ENCOUNTER — Ambulatory Visit: Payer: Medicare Other | Admitting: Obstetrics & Gynecology

## 2018-04-30 ENCOUNTER — Encounter: Payer: Self-pay | Admitting: Obstetrics & Gynecology

## 2018-04-30 VITALS — BP 124/80 | Ht 61.0 in | Wt 115.0 lb

## 2018-04-30 DIAGNOSIS — M8589 Other specified disorders of bone density and structure, multiple sites: Secondary | ICD-10-CM

## 2018-04-30 DIAGNOSIS — D071 Carcinoma in situ of vulva: Secondary | ICD-10-CM

## 2018-04-30 DIAGNOSIS — Z90710 Acquired absence of both cervix and uterus: Secondary | ICD-10-CM

## 2018-04-30 DIAGNOSIS — Z01419 Encounter for gynecological examination (general) (routine) without abnormal findings: Secondary | ICD-10-CM | POA: Diagnosis not present

## 2018-04-30 DIAGNOSIS — Z780 Asymptomatic menopausal state: Secondary | ICD-10-CM | POA: Diagnosis not present

## 2018-04-30 DIAGNOSIS — Z87412 Personal history of vulvar dysplasia: Secondary | ICD-10-CM

## 2018-04-30 DIAGNOSIS — A6004 Herpesviral vulvovaginitis: Secondary | ICD-10-CM

## 2018-04-30 MED ORDER — ALENDRONATE SODIUM 70 MG PO TABS: ORAL_TABLET | ORAL | 4 refills | Status: DC

## 2018-04-30 MED ORDER — VALACYCLOVIR HCL 1 G PO TABS: 1000.0000 mg | ORAL_TABLET | Freq: Every day | ORAL | 4 refills | Status: DC

## 2018-04-30 NOTE — Progress Notes (Signed)
Linda Barber [birth year] [id number]   History:    77 y.o. G2P2L2  RP:  Established patient presenting for annual gyn exam   HPI: Menopause, well on no hormone replacement therapy.  History of total abdominal hysterectomy.  No pelvic pain.  Abstinent.  Recurrence of genital HSV on Rt buttock.  On Valtrex treatment.  History of VIN 3.  Status post wide excision with negative margins many years ago.  Osteopenia on last BD 05/2016, on Fosamax.  Urine and bowel movements normal.  Breasts normal.  Body mass index 21.73.  Health labs with family physician.  Past medical history,surgical history, family history and social history were all reviewed and documented in the EPIC chart.  Gynecologic History No LMP recorded. Patient has had a hysterectomy. Contraception: status post hysterectomy Last Pap: 10/2016. Results were: Negative Last mammogram: 11/2017. Results were: Normal per patient, will obtain report Bone Density: 05/2016 Osteopenia, will repeat 05/2018 scheduled Colonoscopy: 2016  Obstetric History OB History  Gravida Para Term Preterm AB Living  2 2       2   SAB TAB Ectopic Multiple Live Births               # Outcome Date GA Lbr Len/2nd Weight Sex Delivery Anes PTL Lv  2 Para           1 Para              ROS: A ROS was performed and pertinent positives and negatives are included in the history.  GENERAL: No fevers or chills. HEENT: No change in vision, no earache, sore throat or sinus congestion. NECK: No pain or stiffness. CARDIOVASCULAR: No chest pain or pressure. No palpitations. PULMONARY: No shortness of breath, cough or wheeze. GASTROINTESTINAL: No abdominal pain, nausea, vomiting or diarrhea, melena or bright red blood per rectum. GENITOURINARY: No urinary frequency, urgency, hesitancy or dysuria. MUSCULOSKELETAL: No joint or muscle pain, no back pain, no recent trauma. DERMATOLOGIC: No rash, no itching, no lesions. ENDOCRINE: No polyuria, polydipsia, no heat or cold  intolerance. No recent change in weight. HEMATOLOGICAL: No anemia or easy bruising or bleeding. NEUROLOGIC: No headache, seizures, numbness, tingling or weakness. PSYCHIATRIC: No depression, no loss of interest in normal activity or change in sleep pattern.     Exam:   BP 124/80   Ht 5\' 1"  (1.549 m)   Wt 115 lb (52.2 kg)   BMI 21.73 kg/m   Body mass index is 21.73 kg/m.  General appearance : Well developed well nourished female. No acute distress HEENT: Eyes: no retinal hemorrhage or exudates,  Neck supple, trachea midline, no carotid bruits, no thyroidmegaly Lungs: Clear to auscultation, no rhonchi or wheezes, or rib retractions  Heart: Regular rate and rhythm, no murmurs or gallops Breast:Examined in sitting and supine position were symmetrical in appearance, no palpable masses or tenderness,  no skin retraction, no nipple inversion, no nipple discharge, no skin discoloration, no axillary or supraclavicular lymphadenopathy Abdomen: no palpable masses or tenderness, no rebound or guarding Extremities: no edema or skin discoloration or tenderness  Pelvic: Vulva: Normal             Vagina: No gross lesions or discharge.  Pap reflex done.  Cervix/Uterus absent  Adnexa  Without masses or tenderness  Anus: Normal   Assessment/Plan:  77 y.o. female for annual exam   1. Encounter for Papanicolaou smear of vagina as part of routine gynecological examination Gynecologic exam status post total hysterectomy and menopause.  Pap reflex done on the vaginal vault.  Breast exam normal.  Last screening mammogram at [facility] was normal per patient in July 2019.  Will obtain report.  Colonoscopy in 2016.  Health labs with family physician.  Good body mass index at 21.73.  2. S/P TAH (total abdominal hysterectomy)  3. Postmenopausal Well on no hormone replacement therapy.  4. Osteopenia of multiple sites Fosamax represcribed.  Recommend vitamin D supplements, calcium intake of 1.5 g/day and  regular weightbearing physical activities.  5. Vulvar intraepithelial neoplasia (VIN) grade 3 Normal vulva on exam today.  No evidence of dysplasia.  6. Herpes simplex vulvovaginitis Recurrence of HSV on the right buttock.  Patient already taking valacyclovir.  Other orders - alendronate (FOSAMAX) 70 MG tablet; Take with a full glass of water on an empty stomach. - valACYclovir (VALTREX) 1000 MG tablet; Take 1 tablet (1,000 mg total) by mouth daily.  [name] MD, 2:14 PM 04/30/2018

## 2018-04-30 NOTE — Patient Instructions (Signed)
1. Encounter for Papanicolaou smear of vagina as part of routine gynecological examination Gynecologic exam status post total hysterectomy and menopause.  Pap reflex done on the vaginal vault.  Breast exam normal.  Last screening mammogram at [facility] was normal per patient in July 2019.  Will obtain report.  Colonoscopy in 2016.  Health labs with family physician.  Good body mass index at 21.73.  2. S/P TAH (total abdominal hysterectomy)  3. Postmenopausal Well on no hormone replacement therapy.  4. Osteopenia of multiple sites Fosamax represcribed.  Recommend vitamin D supplements, calcium intake of 1.5 g/day and regular weightbearing physical activities.  5. Vulvar intraepithelial neoplasia (VIN) grade 3 Normal vulva on exam today.  No evidence of dysplasia.  6. Herpes simplex vulvovaginitis Recurrence of HSV on the right buttock.  Patient already taking valacyclovir.  Other orders - alendronate (FOSAMAX) 70 MG tablet; Take with a full glass of water on an empty stomach. - valACYclovir (VALTREX) 1000 MG tablet; Take 1 tablet (1,000 mg total) by mouth daily.  Linda Barber, it was a pleasure seeing you today!  I will inform you of your results as soon as they are available.

## 2018-05-04 LAB — PAP IG W/ RFLX HPV ASCU

## 2018-05-27 ENCOUNTER — Encounter (INDEPENDENT_AMBULATORY_CARE_PROVIDER_SITE_OTHER): Payer: Medicare Other

## 2018-05-27 DIAGNOSIS — Z780 Asymptomatic menopausal state: Secondary | ICD-10-CM

## 2018-05-27 DIAGNOSIS — M810 Age-related osteoporosis without current pathological fracture: Secondary | ICD-10-CM

## 2018-05-31 ENCOUNTER — Other Ambulatory Visit: Payer: Self-pay | Admitting: Obstetrics & Gynecology

## 2018-05-31 DIAGNOSIS — M810 Age-related osteoporosis without current pathological fracture: Secondary | ICD-10-CM

## 2018-05-31 DIAGNOSIS — Z780 Asymptomatic menopausal state: Secondary | ICD-10-CM

## 2018-07-01 ENCOUNTER — Encounter: Payer: Self-pay | Admitting: Cardiology

## 2018-07-01 ENCOUNTER — Ambulatory Visit: Payer: Medicare Other | Admitting: Cardiology

## 2018-07-01 VITALS — BP 147/79 | HR 87 | Ht 61.0 in | Wt 114.0 lb

## 2018-07-01 DIAGNOSIS — R000 Tachycardia, unspecified: Secondary | ICD-10-CM | POA: Insufficient documentation

## 2018-07-01 DIAGNOSIS — E7841 Elevated Lipoprotein(a): Secondary | ICD-10-CM

## 2018-07-01 DIAGNOSIS — I10 Essential (primary) hypertension: Secondary | ICD-10-CM | POA: Diagnosis not present

## 2018-07-01 DIAGNOSIS — E785 Hyperlipidemia, unspecified: Secondary | ICD-10-CM | POA: Insufficient documentation

## 2018-07-01 DIAGNOSIS — I6523 Occlusion and stenosis of bilateral carotid arteries: Secondary | ICD-10-CM

## 2018-07-01 DIAGNOSIS — R012 Other cardiac sounds: Secondary | ICD-10-CM | POA: Diagnosis not present

## 2018-07-01 DIAGNOSIS — I7300 Raynaud's syndrome without gangrene: Secondary | ICD-10-CM

## 2018-07-01 MED ORDER — AMLODIPINE BESYLATE 5 MG PO TABS: 5.0000 mg | ORAL_TABLET | Freq: Every day | ORAL | 1 refills | Status: DC

## 2018-07-01 NOTE — Progress Notes (Signed)
Subjective:  Primary Physician:  Benito Mccreedy, MD  Patient ID: Linda Barber, female    DOB: 1940/11/08, 78 y.o.   MRN: [id number]  Chief Complaint  Patient presents with  . Follow-up    pt c/o resting heart rate '@47'$ /cold feet  . Heart Problem    Life line screening showed carotid blockage and low heart rate    HPI: Linda Barber  is a 78 y.o. female who I had last seen in 2016 for evaluation of abnormal BNP, hypertension, hyper-lipidemia, prediabetes mellitus.  She also has mildly elevated LPA.  At that time it is felt that the elevated proBNP was probably related to hypertensive heart disease.  She now presents to reestablish care, underwent Lifeline screening and was found to have carotid atherosclerosis, also EKG revealed heart rate of 45 bpm she's been concerned about this and wanted to be evaluated.  She wants to discuss regarding both low heart rate, carotid atherosclerosis and LPA that had been elevated, has been on niacin and also low-dose simvastatin for last 2-3 years and has not been reevaluated.  She also has Raynaud's and wishes to change carvedilol due to cold extremities.  She is essentially asymptomatic and exercises at least 5-6 days a week for 30 minutes.  No dizziness or palpitations.  Past Medical History:  Diagnosis Date  . Cancer ([place]) 2008   VULVAR CARCINOMA INSITU  . Costochondritis   . Diabetes mellitus without complication ([place])    pt. reports borderline   . Diverticulosis   . High cholesterol   . Hypertension   . Osteopenia   . Osteoporosis   . 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 Shingles     Past Surgical History:  Procedure Laterality Date  . ABDOMINAL HYSTERECTOMY    . CATARACT EXTRACTION    . CHOLECYSTECTOMY N/A 05/03/2014   Procedure: LAPAROSCOPIC CHOLECYSTECTOMY WITH INTRAOPERATIVE CHOLANGIOGRAM;  Surgeon: Autumn Messing III, MD;  Location: [facility];  Service: General;  Laterality: N/A;  . COLONOSCOPY  December 16 2012  . HEMORRHOID SURGERY  2014   . HEMORROIDECTOMY      Social History   Socioeconomic History  . Marital status: Widowed    Spouse name: Not on file  . Number of children: Not on file  . Years of education: Not on file  . Highest education level: Not on file  Occupational History  . Not on file  Social Needs  . Financial resource strain: Not on file  . Food insecurity:    Worry: Not on file    Inability: Not on file  . Transportation needs:    Medical: Not on file    Non-medical: Not on file  Tobacco Use  . Smoking status: Never Smoker  . Smokeless tobacco: Never Used  Substance and Sexual Activity  . Alcohol use: No    Alcohol/week: 0.0 standard drinks  . Drug use: No  . Sexual activity: Not Currently  Lifestyle  . Physical activity:    Days per week: Not on file    Minutes per session: Not on file  . Stress: Not on file  Relationships  . Social connections:    Talks on phone: Not on file    Gets together: Not on file    Attends religious service: Not on file    Active member of club or organization: Not on file    Attends meetings of clubs or organizations: Not on file    Relationship status: Not on file  .  Intimate partner violence:    Fear of current or ex partner: Not on file    Emotionally abused: Not on file    Physically abused: Not on file    Forced sexual activity: Not on file  Other Topics Concern  . Not on file  Social History Narrative  . Not on file    Current Outpatient Medications on File Prior to Visit  Medication Sig Dispense Refill  . alendronate (FOSAMAX) 70 MG tablet Take with a full glass of water on an empty stomach. 12 tablet 4  . aspirin 81 MG tablet Take 81 mg by mouth daily.    . carvedilol (COREG) 6.25 MG tablet Take 3.125 mg by mouth 2 (two) times daily.  3  . cholecalciferol (VITAMIN D) 1000 UNITS tablet Take 2,000 Units by mouth daily.     Marland Kitchen co-enzyme Q-10 50 MG capsule Take 100 mg by mouth daily.     . fish oil-omega-3 fatty acids 1000 MG capsule Take 1 g  by mouth 3 (three) times daily.     Marland Kitchen losartan (COZAAR) 100 MG tablet Take 100 mg by mouth daily.    . Multiple Vitamin (MULTIVITAMIN) capsule Take 1 capsule by mouth daily.    . niacin (NIASPAN) 1000 MG CR tablet Take 1,000 mg by mouth daily.  3  . Polyvinyl Alcohol-Povidone (REFRESH OP) Apply to eye.    . simvastatin (ZOCOR) 10 MG tablet Take 10 mg by mouth at bedtime.     Marland Kitchen thiothixene (NAVANE) 2 MG capsule Take 2 mg by mouth. TWICE A WEEK    . valACYclovir (VALTREX) 1000 MG tablet Take 1 tablet (1,000 mg total) by mouth daily. 20 tablet 4   No current facility-administered medications on file prior to visit.      Review of Systems  Constitutional: Negative for malaise/fatigue and weight loss.  Respiratory: Negative for cough, hemoptysis and shortness of breath.   Cardiovascular: Negative for chest pain, palpitations, claudication and leg swelling.  Gastrointestinal: Negative for abdominal pain, blood in stool, constipation, heartburn and vomiting.  Genitourinary: Negative for dysuria.  Musculoskeletal: Negative for joint pain and myalgias.  Neurological: Negative for dizziness, focal weakness and headaches.  Endo/Heme/Allergies: Does not bruise/bleed easily.  Psychiatric/Behavioral: Negative for depression. The patient is not nervous/anxious.   All other systems reviewed and are negative.      Objective:  Blood pressure (!) 147/79, pulse 87, height '5\' 1"'$  (1.549 m), weight 114 lb (51.7 kg), SpO2 100 %.  Physical Exam  Constitutional: She appears well-developed. No distress.  Petite  HENT:  Head: Atraumatic.  Eyes: Conjunctivae are normal.  Neck: Neck supple. No JVD present. No thyromegaly present.  Cardiovascular: Normal rate, regular rhythm, normal heart sounds and intact distal pulses. Exam reveals no gallop.  No murmur heard. Pulmonary/Chest: Effort normal and breath sounds normal.  Abdominal: Soft. Bowel sounds are normal.  Musculoskeletal: Normal range of motion.         General: No edema.  Neurological: She is alert.  Skin: Skin is warm and dry.  Psychiatric: She has a normal mood and affect.   CARDIAC STUDIES:  Echo 07/03/04: Normal LV systolic function, borderline LVH, no significant valvular abnormality.  Assessment & Recommendations:   1. Atherosclerosis of both carotid arteries By life line screening. Schedule for carotid duplex for bruit/stenosis.   2. Elevated Lp(a) Will check  Repeat levels. Lipids otherwise normal.  I have reviewed her labs from outside.  3. Essential hypertension 04/22/2018: Creatinine 0.76, EGFR 76/88,  potassium 5.4, CMP otherwise normal.  Cholesterol 123, triglycerides 38, HDL 69, LDL 46.  Hemoglobin A1c 5.6%.  CBC normal.  TSH normal.  EKG 07/01/2018: Normal sinus rhythm/borderline short PR interval between 114-1 and 20 ms, ventricular rate 79 bpm, incomplete right bundle branch block.  No evidence of ischemia.  4. Abnormal heart sounds New split second heart sound and also EDM in parasternal border. Will obtain echo.    Recommendation: I have discontinued her carvedilol and switched her to amlodipine 5 mg daily in view of the notes and also marked bradycardia noted on Lifeline screening with heart rate of 45 bpm although today's EKG reveals normal heart rate.  She was on minimal doses of Coreg at 3.125 mg b.i.d.  I'll also obtain an echocardiogram and LPA and see her back in 4-6 weeks.  Adrian Prows, MD, [facility] 07/01/2018, 4:21 PM [facility]. [place] Pager: [phone] Office: [phone] If no answer Cell [phone]

## 2018-07-05 ENCOUNTER — Ambulatory Visit: Payer: Medicare Other

## 2018-07-05 DIAGNOSIS — I6523 Occlusion and stenosis of bilateral carotid arteries: Secondary | ICD-10-CM

## 2018-07-06 LAB — LIPOPROTEIN A (LPA): Lipoprotein (a): 91.4 nmol/L — ABNORMAL HIGH (ref <75.0)

## 2018-07-20 ENCOUNTER — Ambulatory Visit: Payer: Medicare Other

## 2018-07-20 DIAGNOSIS — R012 Other cardiac sounds: Secondary | ICD-10-CM

## 2018-07-20 DIAGNOSIS — I10 Essential (primary) hypertension: Secondary | ICD-10-CM

## 2018-07-26 ENCOUNTER — Other Ambulatory Visit: Payer: Self-pay

## 2018-07-26 DIAGNOSIS — I10 Essential (primary) hypertension: Secondary | ICD-10-CM

## 2018-07-26 MED ORDER — AMLODIPINE BESYLATE 5 MG PO TABS: 5.0000 mg | ORAL_TABLET | Freq: Every day | ORAL | 1 refills | Status: DC

## 2018-08-09 ENCOUNTER — Ambulatory Visit: Payer: Medicare Other | Admitting: Cardiology

## 2018-08-19 ENCOUNTER — Other Ambulatory Visit: Payer: Self-pay | Admitting: Cardiology

## 2018-08-19 DIAGNOSIS — I10 Essential (primary) hypertension: Secondary | ICD-10-CM

## 2018-09-10 ENCOUNTER — Encounter: Payer: Self-pay | Admitting: Cardiology

## 2018-09-10 ENCOUNTER — Other Ambulatory Visit: Payer: Self-pay

## 2018-09-10 ENCOUNTER — Ambulatory Visit (INDEPENDENT_AMBULATORY_CARE_PROVIDER_SITE_OTHER): Payer: Medicare Other | Admitting: Cardiology

## 2018-09-10 VITALS — BP 121/55 | HR 65 | Ht 61.0 in | Wt 112.0 lb

## 2018-09-10 DIAGNOSIS — I7300 Raynaud's syndrome without gangrene: Secondary | ICD-10-CM | POA: Diagnosis not present

## 2018-09-10 DIAGNOSIS — E782 Mixed hyperlipidemia: Secondary | ICD-10-CM | POA: Diagnosis not present

## 2018-09-10 DIAGNOSIS — E7841 Elevated Lipoprotein(a): Secondary | ICD-10-CM

## 2018-09-10 DIAGNOSIS — I10 Essential (primary) hypertension: Secondary | ICD-10-CM

## 2018-09-10 MED ORDER — AMLODIPINE BESYLATE 5 MG PO TABS: 5.0000 mg | ORAL_TABLET | Freq: Every day | ORAL | 3 refills | Status: DC

## 2018-09-10 NOTE — Progress Notes (Signed)
Virtual Visit via Telephone Note: Patient unable to use video assisted device.  This visit type was conducted due to national recommendations for restrictions regarding the COVID-19 Pandemic (e.g. social distancing).  This format is felt to be most appropriate for this patient at this time.  All issues noted in this document were discussed and addressed.  No physical exam was performed.  The patient has consented to conduct a Telehealth visit and understands insurance will be billed.   I connected with@, on 09/10/18 at  by TELEPHONE and verified that I am speaking with the correct person using two identifiers.   I discussed the limitations of evaluation and management by telemedicine and the availability of in person appointments. The patient expressed understanding and agreed to proceed.   I have discussed with patient regarding the safety during COVID Pandemic and steps and precautions to be taken including social distancing, frequent hand wash and use of detergent soap, gels with the patient. I asked the patient to avoid touching mouth, nose, eyes, ears with the hands. I encouraged regular walking around the neighborhood and exercise and regular diet, as long as social distancing can be maintained.  Primary Physician/Referring:  Benito Mccreedy, MD  Patient ID: Yancey Flemings, female    DOB: February 28, 1941, 78 y.o.   MRN: [id number]  Chief Complaint  Patient presents with  . Results    echo, Labs  . Follow-up    6wk    HPI: ELLISHA BANKSON  is a 78 y.o. female  with with minimally abnormal BNP, hypertension, hyper-lipidemia, prediabetes mellitus.  She also has mildly elevated LPA.  Elevated proBNP was probably related to hypertensive heart disease.    She underwent Lifeline screening and was found to have carotid atherosclerosis, also EKG revealed heart rate of 45 bpm and also she has Raynaud's disease as well. On her last OV I switched her to Amlodipine and discontinued Coreg, since then  she has noticed marked improvement in cold extremity and also heart rate has improved and she overall feels well.  States that her blood pressure is also been well-controlled.  She underwent echocardiogram and LPA and carotid duplex.   Past Medical History:  Diagnosis Date  . Cancer ([place]) 2008   VULVAR CARCINOMA INSITU  . Costochondritis   . Diabetes mellitus without complication ([place])    pt. reports borderline   . Diverticulosis   . High cholesterol   . Hypertension   . Osteopenia   . Osteoporosis   . 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 Shingles     Past Surgical History:  Procedure Laterality Date  . ABDOMINAL HYSTERECTOMY    . CATARACT EXTRACTION    . CHOLECYSTECTOMY N/A 05/03/2014   Procedure: LAPAROSCOPIC CHOLECYSTECTOMY WITH INTRAOPERATIVE CHOLANGIOGRAM;  Surgeon: Autumn Messing III, MD;  Location: [place];  Service: General;  Laterality: N/A;  . COLONOSCOPY  December 16 2012  . HEMORRHOID SURGERY  2014  . HEMORROIDECTOMY      Social History   Socioeconomic History  . Marital status: Widowed    Spouse name: Not on file  . Number of children: 1  . Years of education: Not on file  . Highest education level: Not on file  Occupational History  . Not on file  Social Needs  . Financial resource strain: Not on file  . Food insecurity:    Worry: Not on file    Inability: Not on file  . Transportation needs:    Medical: Not on file  Non-medical: Not on file  Tobacco Use  . Smoking status: Never Smoker  . Smokeless tobacco: Never Used  Substance and Sexual Activity  . Alcohol use: No    Alcohol/week: 0.0 standard drinks  . Drug use: No  . Sexual activity: Not Currently  Lifestyle  . Physical activity:    Days per week: Not on file    Minutes per session: Not on file  . Stress: Not on file  Relationships  . Social connections:    Talks on phone: Not on file    Gets together: Not on file    Attends religious service: Not on file    Active member of club or organization: Not  on file    Attends meetings of clubs or organizations: Not on file    Relationship status: Not on file  . Intimate partner violence:    Fear of current or ex partner: Not on file    Emotionally abused: Not on file    Physically abused: Not on file    Forced sexual activity: Not on file  Other Topics Concern  . Not on file  Social History Narrative  . Not on file    Current Outpatient Medications on File Prior to Visit  Medication Sig Dispense Refill  . alendronate (FOSAMAX) 70 MG tablet Take with a full glass of water on an empty stomach. (Patient taking differently: Take with a full glass of water on an empty stomach on saturday) 12 tablet 4  . aspirin 81 MG tablet Take 81 mg by mouth daily.    . cholecalciferol (VITAMIN D) 1000 UNITS tablet Take 2,000 Units by mouth daily.     Marland Kitchen co-enzyme Q-10 50 MG capsule Take 100 mg by mouth daily.     . fish oil-omega-3 fatty acids 1000 MG capsule Take 1 g by mouth 2 (two) times a day.     . losartan (COZAAR) 100 MG tablet Take 100 mg by mouth daily.    . Multiple Vitamin (MULTIVITAMIN) capsule Take 1 capsule by mouth daily.    . niacin (NIASPAN) 1000 MG CR tablet Take 1,000 mg by mouth daily.  3  . Polyvinyl Alcohol-Povidone (REFRESH OP) Apply to eye.    . simvastatin (ZOCOR) 10 MG tablet Take 10 mg by mouth at bedtime.     Marland Kitchen thiothixene (NAVANE) 2 MG capsule Take 2 mg by mouth. TWICE A WEEK    . valACYclovir (VALTREX) 1000 MG tablet Take 1 tablet (1,000 mg total) by mouth daily. 20 tablet 4  . Accu-Chek FastClix Lancets MISC USE LANCETS TO CHECK BLOOD SUGAR TID    . ACCU-CHEK GUIDE test strip USE TEST STRIPS TO CHECK BLOOD SUGAR TID     No current facility-administered medications on file prior to visit.     Review of Systems  Constitution: Negative for chills, decreased appetite, malaise/fatigue and weight gain.  Cardiovascular: Negative for dyspnea on exertion, leg swelling and syncope.  Endocrine: Negative for cold intolerance.   Hematologic/Lymphatic: Does not bruise/bleed easily.  Musculoskeletal: Negative for joint swelling.  Gastrointestinal: Negative for abdominal pain, anorexia and change in bowel habit.  Neurological: Negative for headaches and light-headedness.  Psychiatric/Behavioral: Negative for depression and substance abuse.  All other systems reviewed and are negative.     Objective:  Blood pressure (!) 121/55, pulse 65, height '5\' 1"'$  (1.549 m), weight 112 lb (50.8 kg). Body mass index is 21.16 kg/m. Physical examination was not performed due to telemedicine, examination from last month is as  below.  Physical Exam  Constitutional: She appears well-developed and well-nourished. No distress.  HENT:  Head: Atraumatic.  Eyes: Conjunctivae are normal.  Neck: Neck supple. No JVD present. No thyromegaly present.  Cardiovascular: Normal rate, regular rhythm, normal heart sounds and intact distal pulses. Exam reveals no gallop.  No murmur heard. Pulmonary/Chest: Effort normal and breath sounds normal.  Abdominal: Soft. Bowel sounds are normal.  Musculoskeletal: Normal range of motion.        General: No edema.  Neurological: She is alert.  Skin: Skin is warm and dry.  Psychiatric: She has a normal mood and affect.   Radiology: No results found. Laboratory Examination:   07/05/18 LPA 95   04/22/2018: Creatinine 0.76, EGFR 76/88, potassium 5.4, CMP otherwise normal.  Cholesterol 123, triglycerides 38, HDL 69, LDL 46.  Hemoglobin A1c 5.6%.  CBC normal.  TSH normal. HEMOGLOBIN A1C No results found for: HGBA1C, MPG TSH No results for input(s): TSH in the last 8760 hours.  Cardiac studies:   Carotid artery duplex  07/05/2018:  Minimal soft plaque bilateral carotid arteries. Antegrade right vertebral artery flow. Antegrade left vertebral artery flow.  Echocardiogram 07/20/2018: Left ventricle cavity is normal in size. Moderate concentric hypertrophy of the left ventricle. Hyperdynamic left  ventricle with normal global wall motion. Normal diastolic filling pattern. Calculated EF 66%. Left atrial cavity is normal in size. Aneurysmal interatrial septum without 2D or color Doppler evidence of interatrial shunt. Mild to moderate tricuspid regurgitation. Estimated pulmonary artery systolic pressure 26 mmHg.  Assessment:    Essential hypertension - Plan: amLODipine (NORVASC) 5 MG tablet  Raynaud's phenomenon without gangrene - Plan: amLODipine (NORVASC) 5 MG tablet  Elevated Lp(a)  Mixed hyperlipidemia  EKG 07/01/2018: Normal sinus rhythm/borderline short PR interval between 114-1 and 20 ms, ventricular rate 79 bpm, incomplete right bundle branch block.  No evidence of ischemia.  Recommendations:    Patient's symptoms of Raynaud's disease has improved since being on amlodipine and blood pressure is also very well controlled.  Hence I'll continue present medicines, heart rate is also improved from marked bradycardia and there is overall well-being.  I reviewed the results of the carotid artery duplex, she has very minimal plaque and she is already on a low-dose statin and niacin, has excellent HDL and low LDL, although LPA is elevated, would not recommend any change in therapy.  I also reviewed the echocardiogram and reassured her that the tricuspid valve regurgitation is not clinically significant.  The elevated BNP was related to diastolic dysfunction from hypertensive heart disease and age-related appropriate elevation.  Patient felt reassured.  I'll see her back in a year.  She is on appropriate medical therapy. Although on simvastatin, she is only on minimal 10 mg dose and its appropriate use 5 mg of amlodipine.   Adrian Prows, MD, [facility] 09/10/2018, 9:45 AM [facility]. [place] Pager: [phone] Office: [phone] If no answer Cell [phone]

## 2018-12-06 ENCOUNTER — Encounter: Payer: Self-pay | Admitting: Obstetrics & Gynecology

## 2018-12-20 ENCOUNTER — Ambulatory Visit (INDEPENDENT_AMBULATORY_CARE_PROVIDER_SITE_OTHER): Payer: Medicare Other | Admitting: Obstetrics & Gynecology

## 2018-12-20 ENCOUNTER — Encounter: Payer: Self-pay | Admitting: Obstetrics & Gynecology

## 2018-12-20 ENCOUNTER — Other Ambulatory Visit: Payer: Self-pay

## 2018-12-20 VITALS — BP 124/80

## 2018-12-20 DIAGNOSIS — R300 Dysuria: Secondary | ICD-10-CM

## 2018-12-20 DIAGNOSIS — R350 Frequency of micturition: Secondary | ICD-10-CM

## 2018-12-20 MED ORDER — NITROFURANTOIN MONOHYD MACRO 100 MG PO CAPS: 100.0000 mg | ORAL_CAPSULE | Freq: Two times a day (BID) | ORAL | 0 refills | Status: AC

## 2018-12-20 NOTE — Progress Notes (Signed)
    Linda Barber [birth year] [id number]        78 y.o.  G2P2L2  RP: Urinary frequency with dysuria x 3 weeks  HPI: H/O TAH.  Urinary frequency with dysuria worsening x 3 weeks.  No blood in urine. No vaginal discharge.  No pelvic pain.  No fever.  Abstinent.   OB History  Gravida Para Term Preterm AB Living  2 2       2   SAB TAB Ectopic Multiple Live Births               # Outcome Date GA Lbr Len/2nd Weight Sex Delivery Anes PTL Lv  2 Para           1 Para             Past medical history,surgical history, problem list, medications, allergies, family history and social history were all reviewed and documented in the EPIC chart.   Directed ROS with pertinent positives and negatives documented in the history of present illness/assessment and plan.  Exam:  Vitals:   12/20/18 1550  BP: 124/80   General appearance:  Normal  CVAT negative bilaterally  Abdomen: Suprapubic tenderness  Gynecologic exam: Deferred  U/A:  Yellow clear, Protein negative, Nitrite negative, WBC 10-20, RBC negative, Bacteria few.  Urine culture pending.   Assessment/Plan:  78 y.o. G2P2   1. Urinary frequency Possible acute cystitis.  Push hydration with water recommended.  Will cover with Macrobid 100 mg capsule twice a day for 5 days.  Usage reviewed and prescription sent to pharmacy.  2. Dysuria As above.  Other orders - ketoconazole (NIZORAL) 2 % cream; Apply 1 application topically daily. - nitrofurantoin, macrocrystal-monohydrate, (MACROBID) 100 MG capsule; Take 1 capsule (100 mg total) by mouth 2 (two) times daily for 5 days.  Counseling on above issues and coordination of care more than 50% for 15 minutes.  [name] MD, 4:00 PM 12/20/2018

## 2018-12-22 LAB — URINE CULTURE
MICRO NUMBER:: 734445
Result:: NO GROWTH
SPECIMEN QUALITY:: ADEQUATE

## 2018-12-22 LAB — URINALYSIS, COMPLETE W/RFL CULTURE
Bilirubin Urine: NEGATIVE
Glucose, UA: NEGATIVE
Hgb urine dipstick: NEGATIVE
Hyaline Cast: NONE SEEN /LPF
Ketones, ur: NEGATIVE
Nitrites, Initial: NEGATIVE
Protein, ur: NEGATIVE
RBC / HPF: NONE SEEN /HPF (ref 0–2)
Specific Gravity, Urine: 1.02 (ref 1.001–1.03)
pH: 5.5 (ref 5.0–8.0)

## 2018-12-22 LAB — CULTURE INDICATED

## 2018-12-27 ENCOUNTER — Telehealth: Payer: Self-pay | Admitting: *Deleted

## 2018-12-27 ENCOUNTER — Other Ambulatory Visit: Payer: Self-pay

## 2018-12-27 ENCOUNTER — Other Ambulatory Visit: Payer: Medicare Other

## 2018-12-27 ENCOUNTER — Encounter: Payer: Self-pay | Admitting: Obstetrics & Gynecology

## 2018-12-27 DIAGNOSIS — R300 Dysuria: Secondary | ICD-10-CM

## 2018-12-27 NOTE — Telephone Encounter (Signed)
Repeat clean-catch urine analysis with culture.  Await results to make a decision about treatment

## 2018-12-27 NOTE — Telephone Encounter (Signed)
Dr.Lavoie patient ) patient called to follow up from [place] on 12/20/18 completed Macrobid 100 mg dose on Saturday 12/25/18 reports still having burning with urination at times off/on has increases fluid intake, also reports pressure feeling. While taking medication she felt okay, aware urine culture was negative. No fever, no chills, no lower back pain. Asked what to do? Please advise

## 2018-12-27 NOTE — Patient Instructions (Signed)
1. Urinary frequency Possible acute cystitis.  Push hydration with water recommended.  Will cover with Macrobid 100 mg capsule twice a day for 5 days.  Usage reviewed and prescription sent to pharmacy.  2. Dysuria As above.  Other orders - ketoconazole (NIZORAL) 2 % cream; Apply 1 application topically daily. - nitrofurantoin, macrocrystal-monohydrate, (MACROBID) 100 MG capsule; Take 1 capsule (100 mg total) by mouth 2 (two) times daily for 5 days.  Linda Barber, it was a pleasure seeing you today!  I will inform you of your results as soon as they are available.

## 2018-12-27 NOTE — Telephone Encounter (Signed)
Patient aware, order placed, coming today at 11:00am for U/A

## 2018-12-29 LAB — URINE CULTURE
MICRO NUMBER:: 758376
SPECIMEN QUALITY:: ADEQUATE

## 2018-12-29 LAB — URINALYSIS, COMPLETE W/RFL CULTURE
Bacteria, UA: NONE SEEN /HPF
Bilirubin Urine: NEGATIVE
Glucose, UA: NEGATIVE
Hgb urine dipstick: NEGATIVE
Hyaline Cast: NONE SEEN /LPF
Ketones, ur: NEGATIVE
Nitrites, Initial: NEGATIVE
Protein, ur: NEGATIVE
RBC / HPF: NONE SEEN /HPF (ref 0–2)
Specific Gravity, Urine: 1.007 (ref 1.001–1.03)
pH: 6.5 (ref 5.0–8.0)

## 2018-12-29 LAB — CULTURE INDICATED

## 2019-01-04 ENCOUNTER — Ambulatory Visit (INDEPENDENT_AMBULATORY_CARE_PROVIDER_SITE_OTHER): Payer: Medicare Other | Admitting: Women's Health

## 2019-01-04 ENCOUNTER — Other Ambulatory Visit: Payer: Self-pay

## 2019-01-04 ENCOUNTER — Encounter: Payer: Self-pay | Admitting: Women's Health

## 2019-01-04 VITALS — BP 134/82 | Ht 61.0 in | Wt 116.4 lb

## 2019-01-04 DIAGNOSIS — N949 Unspecified condition associated with female genital organs and menstrual cycle: Secondary | ICD-10-CM | POA: Diagnosis not present

## 2019-01-04 LAB — WET PREP FOR TRICH, YEAST, CLUE

## 2019-01-04 MED ORDER — PHENAZOPYRIDINE HCL 200 MG PO TABS: 200.0000 mg | ORAL_TABLET | Freq: Three times a day (TID) | ORAL | 0 refills | Status: DC | PRN

## 2019-01-04 NOTE — Patient Instructions (Signed)
A&D ointment  Apply twice daily,  Keep open to air  Dysuria Dysuria is pain or discomfort while urinating. The pain or discomfort may be felt in the part of your body that drains urine from the bladder (urethra) or in the surrounding tissue of the genitals. The pain may also be felt in the groin area, lower abdomen, or lower back. You may have to urinate frequently or have the sudden feeling that you have to urinate (urgency). Dysuria can affect both men and women, but it is more common in women. Dysuria can be caused by many different things, including:  Urinary tract infection.  Kidney stones or bladder stones.  Certain sexually transmitted infections (STIs), such as chlamydia.  Dehydration.  Inflammation of the tissues of the vagina.  Use of certain medicines.  Use of certain soaps or scented products that cause irritation. Follow these instructions at home: General instructions  Watch your condition for any changes.  Urinate often. Avoid holding urine for long periods of time.  After a bowel movement or urination, women should cleanse from front to back, using each tissue only once.  Urinate after sexual intercourse.  Keep all follow-up visits as told by your health care provider. This is important.  If you had any tests done to find the cause of dysuria, it is up to you to get your test results. Ask your health care provider, or the department that is doing the test, when your results will be ready. Eating and drinking   Drink enough fluid to keep your urine pale yellow.  Avoid caffeine, tea, and alcohol. They can irritate the bladder and make dysuria worse. In men, alcohol may irritate the prostate. Medicines  Take over-the-counter and prescription medicines only as told by your health care provider.  If you were prescribed an antibiotic medicine, take it as told by your health care provider. Do not stop taking the antibiotic even if you start to feel better. Contact  a health care provider if:  You have a fever.  You develop pain in your back or sides.  You have nausea or vomiting.  You have blood in your urine.  You are not urinating as often as you usually do. Get help right away if:  Your pain is severe and not relieved with medicines.  You cannot eat or drink without vomiting.  You are confused.  You have a rapid heartbeat while at rest.  You have shaking or chills.  You feel extremely weak. Summary  Dysuria is pain or discomfort while urinating. Many different conditions can lead to dysuria.  If you have dysuria, you may have to urinate frequently or have the sudden feeling that you have to urinate (urgency).  Watch your condition for any changes. Keep all follow-up visits as told by your health care provider.  Make sure that you urinate often and drink enough fluid to keep your urine pale yellow. This information is not intended to replace advice given to you by your health care provider. Make sure you discuss any questions you have with your health care provider. Document Released: 02/01/2004 Document Revised: 04/17/2017 Document Reviewed: 02/19/2017 Elsevier Patient Education  2020 [employer].

## 2019-01-04 NOTE — Progress Notes (Signed)
78 year old W BF G2, P2 presents with complaint of urinary burning throughout stream of urination.  Was treated for a UTI with Macrobid twice daily for 5 days 12/20/2018 urine culture negative per Dr. Dellis Filbert.  Has persisted with urinary burning symptoms since.  States is drinking more water now 9 to 10 glasses daily which has helped the burning sensation.  Denies vaginal discharge, itching, odor,\abdominal pain or fever.  Not sexually active in years.  Postmenopausal on no HRT with no bleeding.  Retired from [employer].  Medical problems include hypertension, diabetes, hypercholesteremia and osteoporosis primary care manages.  History of HSV .  Exam: Appears well.  No CVAT.  Abdomen soft, nontender, no rebound or radiation, external genitalia atrophic, discomfort at urinary meatus, wet prep done with a Q-tip, negative yeast, clues, many bacteria. UA: +1 leukocytes, negative nitrites, 5-10 WBCs, no RBCs, 0-5 squamous epithelials, few bacteria  Urinary burning  Plan: Urine culture pending.  We will try Pyridium 200mg  3 times daily as needed for several days and call if no relief of symptoms.  Encouraged to continue drinking 8 to 10 glasses of water daily, UTI prevention discussed, open to air as able, apply small amount of A&E ointment externally, pat dry after urination.

## 2019-01-07 ENCOUNTER — Telehealth: Payer: Self-pay | Admitting: *Deleted

## 2019-01-07 ENCOUNTER — Telehealth: Payer: Self-pay

## 2019-01-07 ENCOUNTER — Other Ambulatory Visit: Payer: Self-pay | Admitting: Women's Health

## 2019-01-07 ENCOUNTER — Other Ambulatory Visit: Payer: Self-pay

## 2019-01-07 LAB — URINALYSIS, COMPLETE W/RFL CULTURE
Bilirubin Urine: NEGATIVE
Glucose, UA: NEGATIVE
Hgb urine dipstick: NEGATIVE
Hyaline Cast: NONE SEEN /LPF
Ketones, ur: NEGATIVE
Nitrites, Initial: NEGATIVE
Protein, ur: NEGATIVE
RBC / HPF: NONE SEEN /HPF (ref 0–2)
Specific Gravity, Urine: 1.003 (ref 1.001–1.03)
pH: 6.5 (ref 5.0–8.0)

## 2019-01-07 LAB — URINE CULTURE
MICRO NUMBER:: 788141
SPECIMEN QUALITY:: ADEQUATE

## 2019-01-07 LAB — CULTURE INDICATED

## 2019-01-07 MED ORDER — AMPICILLIN 250 MG PO CAPS: 250.0000 mg | ORAL_CAPSULE | Freq: Four times a day (QID) | ORAL | 0 refills | Status: DC

## 2019-01-07 MED ORDER — PHENAZOPYRIDINE HCL 200 MG PO TABS: 200.0000 mg | ORAL_TABLET | Freq: Three times a day (TID) | ORAL | 0 refills | Status: DC | PRN

## 2019-01-07 MED ORDER — AMPICILLIN 500 MG PO CAPS: 500.0000 mg | ORAL_CAPSULE | Freq: Two times a day (BID) | ORAL | 0 refills | Status: DC

## 2019-01-07 NOTE — Telephone Encounter (Signed)
Ok bid for 5 days #10

## 2019-01-07 NOTE — Telephone Encounter (Signed)
-----   Message from Huel Cote, NP sent at 01/07/2019 11:58 AM EDT ----- Please call and review urine culture showed a small ant of bacterial, ampicillin 250 mg 4 times a day for 5 days. Tell her to call if continued symptoms

## 2019-01-07 NOTE — Telephone Encounter (Signed)
New Rx sent, left detailed message on cell.

## 2019-01-07 NOTE — Telephone Encounter (Signed)
That's fine

## 2019-01-07 NOTE — Telephone Encounter (Addendum)
Linda Barber prescribed ampicillin 250 mg tablet today,pharmacy said they don't have this dose in on back order, however they do have the ampicillin 500 mg dose in stock if this is an option. Please advise

## 2019-01-07 NOTE — Telephone Encounter (Signed)
Linda Barber, I informed patient.  She said that she had relief with the Pyridium for the first time today but only has a few pills left. She asked if we could refill it. You had given her #15. I went ahead and sent a refill for the Pyridium. I assumed you would be okay with that. She will call if sx persist after antibiotic.

## 2019-01-11 ENCOUNTER — Other Ambulatory Visit: Payer: Self-pay | Admitting: Women's Health

## 2019-01-11 ENCOUNTER — Telehealth: Payer: Self-pay

## 2019-01-11 NOTE — Telephone Encounter (Signed)
I thought this had already been rectified, ampicillin 500 mg twice daily for 5 days #10 I will call pharmacy and patient.

## 2019-01-11 NOTE — Telephone Encounter (Signed)
Ampicillin 250 was prescribed for patient.  Note from pharmacy states it is on manufacturer back order. Please advise regarding changing Rx.

## 2019-01-11 NOTE — Telephone Encounter (Signed)
Telephone call to clarify prescription.  Patient is on fifth day of taking ampicillin 500 mg twice daily and is feeling better.  Will complete prescription.  Reviewed does not need to get the 250 mg tablets. [facility] also notified.

## 2019-01-11 NOTE — Telephone Encounter (Signed)
Agree with Amoxyl 500 mg TID

## 2019-01-13 ENCOUNTER — Telehealth: Payer: Self-pay | Admitting: *Deleted

## 2019-01-13 NOTE — Telephone Encounter (Signed)
Patient called today is the last day for ampicillin 500 mg capsules reports around 9pm having burning with urination, she increases her fluids, later that night had nausea fell to the floor and hit her face, reports she feels okay no need to see doctor due to fall. Patient said her stomach was hurting,she did have a bowel movement and still had burning with urination, now drinking up to 13 glasses of water. Asked if she should stop the medication, I told her not to take yet and I will relay to you. She did have burning with urination Tuesday while taking medication as well. She reports having flu shot 5 days before starting antibiotic. No fever, chills, reports feeling weak, but other wise doing okay, just can't understand why she keeps having symptoms. Please advise

## 2019-01-13 NOTE — Telephone Encounter (Signed)
Telephone call, states is feeling better today with minimal burning, no nausea.  States does not feel dizzy and states did not have an injury when she fell down.  Will hold off and not take last day of ampicillin.  Encouraged to rest today get up slowly, and let us know if she continues to have problems.

## 2019-05-02 ENCOUNTER — Other Ambulatory Visit: Payer: Self-pay

## 2019-05-03 ENCOUNTER — Ambulatory Visit (INDEPENDENT_AMBULATORY_CARE_PROVIDER_SITE_OTHER): Payer: Medicare Other | Admitting: Obstetrics & Gynecology

## 2019-05-03 ENCOUNTER — Encounter: Payer: Self-pay | Admitting: Obstetrics & Gynecology

## 2019-05-03 VITALS — BP 118/78 | Ht 60.0 in | Wt 115.0 lb

## 2019-05-03 DIAGNOSIS — R300 Dysuria: Secondary | ICD-10-CM

## 2019-05-03 DIAGNOSIS — Z01419 Encounter for gynecological examination (general) (routine) without abnormal findings: Secondary | ICD-10-CM | POA: Diagnosis not present

## 2019-05-03 DIAGNOSIS — Z90710 Acquired absence of both cervix and uterus: Secondary | ICD-10-CM

## 2019-05-03 DIAGNOSIS — Z87412 Personal history of vulvar dysplasia: Secondary | ICD-10-CM

## 2019-05-03 DIAGNOSIS — Z780 Asymptomatic menopausal state: Secondary | ICD-10-CM

## 2019-05-03 DIAGNOSIS — M8589 Other specified disorders of bone density and structure, multiple sites: Secondary | ICD-10-CM | POA: Diagnosis not present

## 2019-05-03 DIAGNOSIS — N898 Other specified noninflammatory disorders of vagina: Secondary | ICD-10-CM

## 2019-05-03 DIAGNOSIS — D071 Carcinoma in situ of vulva: Secondary | ICD-10-CM

## 2019-05-03 DIAGNOSIS — A6004 Herpesviral vulvovaginitis: Secondary | ICD-10-CM

## 2019-05-03 LAB — WET PREP FOR TRICH, YEAST, CLUE

## 2019-05-03 MED ORDER — ACYCLOVIR 5 % EX OINT: 1.0000 "application " | TOPICAL_OINTMENT | CUTANEOUS | 3 refills | Status: DC

## 2019-05-03 MED ORDER — VALACYCLOVIR HCL 500 MG PO TABS: 500.0000 mg | ORAL_TABLET | Freq: Every day | ORAL | 4 refills | Status: DC

## 2019-05-03 NOTE — Progress Notes (Signed)
Linda Barber Jun 05, 1940 [id number]   History:    77 y.o. G2P2L2 G2P2L2  RP:  Established patient presenting for annual gyn exam   HPI: Menopause, well on no hormone replacement therapy.  History of total abdominal hysterectomy.  No pelvic pain.  Abstinent.  Recurrence of genital HSV on Rt buttock.  Now having recurrent burning at anterior vulva, worse during urination and after passing urine.  Urine culture negative 12/27/18 and Strep G 01/04/19.  On Valtrex treatment.  History of VIN 3.  Status post wide excision with negative margins many years ago.  Osteopenia on last BD 05/2018, significantly increased on Fosamax.  Bowel movements normal.  Breasts normal.  Body mass index 22.46.  Health labs with family physician.  Past medical history,surgical history, family history and social history were all reviewed and documented in the EPIC chart.  Gynecologic History No LMP recorded. Patient has had a hysterectomy.   Obstetric History OB History  Gravida Para Term Preterm AB Living  2 2       2   SAB TAB Ectopic Multiple Live Births               # Outcome Date GA Lbr Len/2nd Weight Sex Delivery Anes PTL Lv  2 Para           1 Para              ROS: A ROS was performed and pertinent positives and negatives are included in the history.  GENERAL: No fevers or chills. HEENT: No change in vision, no earache, sore throat or sinus congestion. NECK: No pain or stiffness. CARDIOVASCULAR: No chest pain or pressure. No palpitations. PULMONARY: No shortness of breath, cough or wheeze. GASTROINTESTINAL: No abdominal pain, nausea, vomiting or diarrhea, melena or bright red blood per rectum. GENITOURINARY: No urinary frequency, urgency, hesitancy or dysuria. MUSCULOSKELETAL: No joint or muscle pain, no back pain, no recent trauma. DERMATOLOGIC: No rash, no itching, no lesions. ENDOCRINE: No polyuria, polydipsia, no heat or cold intolerance. No recent change in weight. HEMATOLOGICAL: No anemia or easy  bruising or bleeding. NEUROLOGIC: No headache, seizures, numbness, tingling or weakness. PSYCHIATRIC: No depression, no loss of interest in normal activity or change in sleep pattern.     Exam:   BP 118/78   Ht 5' (1.524 m)   Wt 115 lb (52.2 kg)   BMI 22.46 kg/m   Body mass index is 22.46 kg/m.  General appearance : Well developed well nourished female. No acute distress HEENT: Eyes: no retinal hemorrhage or exudates,  Neck supple, trachea midline, no carotid bruits, no thyroidmegaly Lungs: Clear to auscultation, no rhonchi or wheezes, or rib retractions  Heart: Regular rate and rhythm, no murmurs or gallops Breast:Examined in sitting and supine position were symmetrical in appearance, no palpable masses or tenderness,  no skin retraction, no nipple inversion, no nipple discharge, no skin discoloration, no axillary or supraclavicular lymphadenopathy Abdomen: no palpable masses or tenderness, no rebound or guarding Extremities: no edema or skin discoloration or tenderness  Pelvic: Vulva: Small ulcer c/w herpes on Rt anterior labia minora, close to urethra.             Vagina: No gross lesions or discharge.  Wet prep done.  Cervix/uterus absent  Adnexa  Without masses or tenderness  Anus: Normal  Wet prep negative  U/A: Yellow clear, protein negative, nitrites negative, white blood cells 0-5, red blood cells negative, no bacteria.  Urine culture pending.  Assessment/Plan:  78 y.o. female for annual exam   1. Well female exam with routine gynecological exam Gynecologic exam status post TAH and menopause.  No indication to perform a Pap test this year.  Breast exam normal.  Last screening mammogram July 2020 was benign.  Colonoscopy 2016.  Health labs with family physician.  2. S/P TAH (total abdominal hysterectomy)  3. Postmenopausal Well on no hormone replacement therapy.  4. Osteopenia of multiple sites Last bone density January 2020 showed osteopenia with the lowest T  score at the lumbar spine at -1.8.  We will repeat a bone density at 2 years.  Vitamin D supplements, calcium intake of 1200 mg daily and regular weightbearing physical activity is recommended.  5. Herpes simplex vulvovaginitis History of genital herpes.  Small vulvar ulcer compatible with herpes on the right anterior labia minora close to the urethra.  Probably responsible for patient's burning sensation during urination and after.  Decision to start on valacyclovir 500 mg per mouth daily as prophylaxis.  Zovirax ointment also prescribed to use locally as needed.  6. Vulvar intraepithelial neoplasia (VIN) grade 3 History of VIN 3 status post wide excision with negative margins many years ago.  7. Vaginal irritation Wet prep negative. - WET PREP FOR [place], YEAST, CLUE  8. Dysuria Urine analysis basically negative.  Pending urine culture. - Urinalysis,Complete w/RFL Culture  Other orders - phenazopyridine (PYRIDIUM) 100 MG tablet; Take 100 mg by mouth 3 (three) times daily as needed for pain. - valACYclovir (VALTREX) 500 MG tablet; Take 1 tablet (500 mg total) by mouth daily. Prophylaxis - acyclovir ointment (ZOVIRAX) 5 %; Apply 1 application topically every 3 (three) hours. - Urine Culture - REFLEXIVE URINE CULTURE  Counseling on above issues and coordination of care more than 50% for 10 minutes.  [name] MD, 1:04 PM 05/03/2019

## 2019-05-04 ENCOUNTER — Encounter: Payer: Self-pay | Admitting: Obstetrics & Gynecology

## 2019-05-04 NOTE — Patient Instructions (Signed)
1. Well female exam with routine gynecological exam Gynecologic exam status post TAH and menopause.  No indication to perform a Pap test this year.  Breast exam normal.  Last screening mammogram July 2020 was benign.  Colonoscopy 2016.  Health labs with family physician.  2. S/P TAH (total abdominal hysterectomy)  3. Postmenopausal Well on no hormone replacement therapy.  4. Osteopenia of multiple sites Last bone density January 2020 showed osteopenia with the lowest T score at the lumbar spine at -1.8.  We will repeat a bone density at 2 years.  Vitamin D supplements, calcium intake of 1200 mg daily and regular weightbearing physical activity is recommended.  5. Herpes simplex vulvovaginitis History of genital herpes.  Small vulvar ulcer compatible with herpes on the right anterior labia minora close to the urethra.  Probably responsible for patient's burning sensation during urination and after.  Decision to start on valacyclovir 500 mg per mouth daily as prophylaxis.  Zovirax ointment also prescribed to use locally as needed.  6. Vulvar intraepithelial neoplasia (VIN) grade 3 History of VIN 3 status post wide excision with negative margins many years ago.  7. Vaginal irritation Wet prep negative. - WET PREP FOR [place], YEAST, CLUE  8. Dysuria Urine analysis basically negative.  Pending urine culture. - Urinalysis,Complete w/RFL Culture  Other orders - phenazopyridine (PYRIDIUM) 100 MG tablet; Take 100 mg by mouth 3 (three) times daily as needed for pain. - valACYclovir (VALTREX) 500 MG tablet; Take 1 tablet (500 mg total) by mouth daily. Prophylaxis - acyclovir ointment (ZOVIRAX) 5 %; Apply 1 application topically every 3 (three) hours. - Urine Culture - REFLEXIVE URINE CULTURE

## 2019-05-05 LAB — URINALYSIS, COMPLETE W/RFL CULTURE
Bacteria, UA: NONE SEEN /HPF
Bilirubin Urine: NEGATIVE
Glucose, UA: NEGATIVE
Hgb urine dipstick: NEGATIVE
Hyaline Cast: NONE SEEN /LPF
Ketones, ur: NEGATIVE
Nitrites, Initial: NEGATIVE
Protein, ur: NEGATIVE
RBC / HPF: NONE SEEN /HPF (ref 0–2)
Specific Gravity, Urine: 1.01 (ref 1.001–1.03)
pH: 5.5 (ref 5.0–8.0)

## 2019-05-05 LAB — URINE CULTURE
MICRO NUMBER:: 1199166
Result:: NO GROWTH
SPECIMEN QUALITY:: ADEQUATE

## 2019-05-05 LAB — CULTURE INDICATED

## 2019-07-08 ENCOUNTER — Other Ambulatory Visit: Payer: Self-pay | Admitting: Obstetrics & Gynecology

## 2019-07-12 DIAGNOSIS — Z789 Other specified health status: Secondary | ICD-10-CM | POA: Diagnosis not present

## 2019-07-12 DIAGNOSIS — E038 Other specified hypothyroidism: Secondary | ICD-10-CM | POA: Diagnosis not present

## 2019-07-12 DIAGNOSIS — E119 Type 2 diabetes mellitus without complications: Secondary | ICD-10-CM | POA: Diagnosis not present

## 2019-07-12 DIAGNOSIS — E559 Vitamin D deficiency, unspecified: Secondary | ICD-10-CM | POA: Diagnosis not present

## 2019-07-12 DIAGNOSIS — M818 Other osteoporosis without current pathological fracture: Secondary | ICD-10-CM | POA: Diagnosis not present

## 2019-07-12 DIAGNOSIS — E7800 Pure hypercholesterolemia, unspecified: Secondary | ICD-10-CM | POA: Diagnosis not present

## 2019-07-28 ENCOUNTER — Ambulatory Visit: Payer: Medicare PPO | Attending: Internal Medicine

## 2019-07-28 DIAGNOSIS — Z23 Encounter for immunization: Secondary | ICD-10-CM

## 2019-07-28 NOTE — Progress Notes (Signed)
   Covid-19 Vaccination Clinic  Name:  Linda Barber    MRN: [id number] DOB: 1940/12/13  07/28/2019  Linda Barber was observed post Covid-19 immunization for 15 minutes without incident. She was provided with Vaccine Information Sheet and instruction to access the V-Safe system.   Linda Barber was instructed to call 911 with any severe reactions post vaccine: Marland Kitchen Difficulty breathing  . Swelling of face and throat  . A fast heartbeat  . A bad rash all over body  . Dizziness and weakness   Immunizations Administered    Name Date Dose VIS Date Route   Pfizer COVID-19 Vaccine 07/28/2019  9:23 AM 0.3 mL 04/29/2019 Intramuscular   Manufacturer: [place]   Lot: [id number]   [place]: [id number]

## 2019-08-22 ENCOUNTER — Ambulatory Visit: Payer: Medicare PPO | Attending: Internal Medicine

## 2019-08-22 DIAGNOSIS — Z23 Encounter for immunization: Secondary | ICD-10-CM

## 2019-08-22 NOTE — Progress Notes (Signed)
   Covid-19 Vaccination Clinic  Name:  Linda Barber    MRN: [id number] DOB: 24-Feb-1941  08/22/2019  [name] was observed post Covid-19 immunization for 30 minutes based on pre-vaccination screening without incident. She was provided with Vaccine Information Sheet and instruction to access the V-Safe system.   [name] was instructed to call 911 with any severe reactions post vaccine: Marland Kitchen Difficulty breathing  . Swelling of face and throat  . A fast heartbeat  . A bad rash all over body  . Dizziness and weakness   Immunizations Administered    Name Date Dose VIS Date Route   Pfizer COVID-19 Vaccine 08/22/2019  9:49 AM 0.3 mL 04/29/2019 Intramuscular   Manufacturer: [place]   Lot: [id number]   [place]: [id number]

## 2019-08-23 ENCOUNTER — Other Ambulatory Visit: Payer: Self-pay | Admitting: Cardiology

## 2019-08-23 ENCOUNTER — Other Ambulatory Visit: Payer: Self-pay | Admitting: Obstetrics & Gynecology

## 2019-08-23 DIAGNOSIS — I7300 Raynaud's syndrome without gangrene: Secondary | ICD-10-CM

## 2019-08-23 DIAGNOSIS — I10 Essential (primary) hypertension: Secondary | ICD-10-CM

## 2019-09-08 ENCOUNTER — Telehealth: Payer: Self-pay

## 2019-09-08 NOTE — Progress Notes (Addendum)
Primary Physician/Referring:  Trey Sailors, PA  Patient ID: Linda Barber, female    DOB: Apr 30, 1941, 79 y.o.   MRN: [id number]  Chief Complaint  Patient presents with  . Hypertension  . Tricuspid Regurgitation  . Follow-up    1 year   HPI:    Linda Barber  is a 79 y.o. African Amercian female with chronic diastolic heart failure, hypertension, prediabetes mellitus, hyper-lipidemia, mildly elevated LPA,  Raynaud's disease and moderate TR on echo 07/2018.   She remains asymptomatic, presents here for annual visit.  Since being on amlodipine, she has not had significant Raynaud's disease symptoms this fall and winter.  Past Medical History:  Diagnosis Date  . Cancer ([place]) 2008   VULVAR CARCINOMA INSITU  . Costochondritis   . Diabetes mellitus without complication ([place])    pt. reports borderline   . Diverticulosis   . High cholesterol   . Hypertension   . Osteopenia   . Osteoporosis   . 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 Shingles    Past Surgical History:  Procedure Laterality Date  . ABDOMINAL HYSTERECTOMY    . CATARACT EXTRACTION    . CHOLECYSTECTOMY N/A 05/03/2014   Procedure: LAPAROSCOPIC CHOLECYSTECTOMY WITH INTRAOPERATIVE CHOLANGIOGRAM;  Surgeon: Autumn Messing III, MD;  Location: [facility];  Service: General;  Laterality: N/A;  . COLONOSCOPY  December 16 2012  . HEMORRHOID SURGERY  2014  . HEMORROIDECTOMY     Social History   Tobacco Use  . Smoking status: Never Smoker  . Smokeless tobacco: Never Used  Substance Use Topics  . Alcohol use: No    Alcohol/week: 0.0 standard drinks   Marital Status: Widowed  ROS  Review of Systems  Cardiovascular: Negative for chest pain, dyspnea on exertion and leg swelling.  Gastrointestinal: Negative for melena.   Objective  Blood pressure (!) 153/69, pulse 80, temperature 98.5 F (36.9 C), temperature source Temporal, resp. rate 17, height 5' (1.524 m), weight 115 lb (52.2 kg), SpO2 98 %.  Vitals with BMI 09/09/2019 05/03/2019  01/04/2019  Height _0  _1  _2   Weight 115 lbs 115 lbs 116 lbs 6 oz  BMI 22.46 11.57 26.20  Systolic 355 974 163  Diastolic 69 78 82  Pulse 80 - -     Physical Exam  Cardiovascular: Normal rate, regular rhythm, normal heart sounds and intact distal pulses. Exam reveals no gallop.  No murmur heard. No leg edema, no JVD.  Pulmonary/Chest: Effort normal and breath sounds normal.  Abdominal: Soft. Bowel sounds are normal.   Laboratory examination:   No results for input(s): NA, K, CL, CO2, GLUCOSE, BUN, CREATININE, CALCIUM, GFRNONAA, GFRAA in the last 8760 hours. CrCl cannot be calculated (Patient's most recent lab result is older than the maximum 21 days allowed.).  CMP Latest Ref Rng & Units 04/26/2014  Glucose 70 - 99 mg/dL 91  BUN 6 - 23 mg/dL 15  Creatinine 0.50 - 1.10 mg/dL 0.66  Sodium 137 - 147 mEq/L 136(L)  Potassium 3.7 - 5.3 mEq/L 4.4  Chloride 96 - 112 mEq/L 100  CO2 19 - 32 mEq/L 23  Calcium 8.4 - 10.5 mg/dL 10.2  Total Protein 6.0 - 8.3 g/dL 8.1  Total Bilirubin 0.3 - 1.2 mg/dL 0.3  Alkaline Phos 39 - 117 U/L 64  AST 0 - 37 U/L 27  ALT 0 - 35 U/L 21   CBC Latest Ref Rng & Units 04/26/2014  WBC 4.0 - 10.5 K/uL 5.1  Hemoglobin 12.0 -  15.0 g/dL 13.3  Hematocrit 36.0 - 46.0 % 41.5  Platelets 150 - 400 K/uL 235   External labs:   07/05/18: LPA 95   04/22/2018: Creatinine 0.76, EGFR 76/88, potassium 5.4, CMP otherwise normal.  Cholesterol 123, triglycerides 38, HDL 69, LDL 46.  Hemoglobin A1c 5.6%.  CBC normal.  TSH normal.  Medications and allergies   Allergies  Allergen Reactions  . Ampicillin Nausea And Vomiting    Dizzines   . Lamisil [Terbinafine]     numbness  . Codeine Nausea And Vomiting     Current Outpatient Medications  Medication Instructions  . Accu-Chek FastClix Lancets MISC USE LANCETS TO CHECK BLOOD SUGAR TID  . ACCU-CHEK GUIDE test strip USE TEST STRIPS TO CHECK BLOOD SUGAR TID  . alendronate (FOSAMAX) 70 MG tablet TAKE WITH A FULL  GLASS OF WATER ON AN EMPTY STOMACH.  Marland Kitchen amLODipine (NORVASC) 10 mg, Oral, Daily  . aspirin 81 mg, Daily  . cholecalciferol (VITAMIN D) 2,000 Units, Oral, Daily  . co-enzyme Q-10 100 mg, Oral, Daily  . fish oil-omega-3 fatty acids 1 g, Oral, 2 times daily  . losartan (COZAAR) 100 mg, Oral, Daily, (2) 31m tablets daily  . Multiple Vitamin (MULTIVITAMIN) capsule 1 capsule, Oral, Daily  . niacin (NIASPAN) 1,000 mg, Oral, Daily  . phenazopyridine (PYRIDIUM) 100 mg, Oral, 3 times daily PRN  . Polyvinyl Alcohol-Povidone (REFRESH OP) Ophthalmic  . simvastatin (ZOCOR) 10 mg, Oral, Daily at bedtime  . thiothixene (NAVANE) 2 mg  . valACYclovir (VALTREX) 500 mg, Oral, Daily, Prophylaxis    Radiology:  No results found.  Cardiac Studies:   Carotid artery duplex  07/05/2018:  Minimal soft plaque bilateral carotid arteries. Antegrade right vertebral artery flow. Antegrade left vertebral artery flow.  Echocardiogram 07/20/2018: Left ventricle cavity is normal in size. Moderate concentric hypertrophy of the left ventricle. Hyperdynamic left ventricle with normal global wall motion. Normal diastolic filling pattern. Calculated EF 66%. Left atrial cavity is normal in size. Aneurysmal interatrial septum without 2D or color Doppler evidence of interatrial shunt. Mild to moderate tricuspid regurgitation. Estimated pulmonary artery systolic pressure 26 mmHg.  EKG:  EKG 09/06/2019: Sinus rhythm with short PR interval at the rate of 77 bpm, right atrial enlargement, normal axis.  Incomplete right bundle branch block.  Otherwise normal EKG.  No significant change from EKG 07/01/2018.  Assessment     ICD-10-CM   1. Essential hypertension  I10 EKG 12-Lead    PCV ECHOCARDIOGRAM COMPLETE    amLODipine (NORVASC) 10 MG tablet  2. Moderate tricuspid regurgitation  I07.1 PCV ECHOCARDIOGRAM COMPLETE  3. Raynaud's phenomenon without gangrene  I73.00 amLODipine (NORVASC) 10 MG tablet   Meds ordered this  encounter  Medications  . amLODipine (NORVASC) 10 MG tablet    Sig: Take 1 tablet (10 mg total) by mouth daily.    Dispense:  90 tablet    Refill:  3    Medications Discontinued During This Encounter  Medication Reason  . amLODipine (NORVASC) 5 MG tablet Reorder     Recommendations:   Linda Barber is a 79y.o.  African Amercian female with chronic diastolic heart failure, hypertension, prediabetes mellitus, hyper-lipidemia, mildly elevated LPA,  Raynaud's disease and moderate TR on echo 07/2018.   She remains asymptomatic, presents here for annual visit. She is already on a low-dose statin and niacin, has excellent HDL and low LDL, although LPA is elevated, would not recommend any change in therapy.    With regard to hypertension, presently  well controlled, she has not been sleeping well due to UTI and blood pressure is elevated.  However in view of history of Raynaud's disease and also hypertension, I will increase the Ambien from 5 mg to 10 mg daily.  Tricuspid valve vegetation may be related to chronic diastolic heart failure.  She has no other acute tissue disorders and no clinical evidence of right-sided heart failure or pulmonary hypertension.  Her EKG does show short PR interval prominence of right atrium.  I will repeat another echocardiogram prior to her next OV, unless it shows progression of tricuspid vegetation or pulmonary hypertension, we will continue to monitor this clinically, I will see her back in a year.  Adrian Prows, MD, [facility] 09/09/2019, 10:08 AM [facility] [place] Office: [phone]

## 2019-09-09 ENCOUNTER — Ambulatory Visit: Payer: Medicare PPO | Admitting: Cardiology

## 2019-09-09 ENCOUNTER — Encounter: Payer: Self-pay | Admitting: Cardiology

## 2019-09-09 ENCOUNTER — Other Ambulatory Visit: Payer: Self-pay

## 2019-09-09 VITALS — BP 153/69 | HR 80 | Temp 98.5°F | Resp 17 | Ht 60.0 in | Wt 115.0 lb

## 2019-09-09 DIAGNOSIS — I7300 Raynaud's syndrome without gangrene: Secondary | ICD-10-CM

## 2019-09-09 DIAGNOSIS — I10 Essential (primary) hypertension: Secondary | ICD-10-CM | POA: Diagnosis not present

## 2019-09-09 DIAGNOSIS — I071 Rheumatic tricuspid insufficiency: Secondary | ICD-10-CM | POA: Diagnosis not present

## 2019-09-09 MED ORDER — AMLODIPINE BESYLATE 10 MG PO TABS: 10.0000 mg | ORAL_TABLET | Freq: Every day | ORAL | 3 refills | Status: DC

## 2019-09-09 NOTE — Addendum Note (Signed)
Addended by: Kela Millin on: 09/09/2019 10:09 AM   Modules accepted: Orders

## 2019-09-12 ENCOUNTER — Telehealth: Payer: Self-pay

## 2019-09-12 NOTE — Telephone Encounter (Signed)
Yes, perfect timing to stop it after 4 1/2 yrs given her BD 05/2018 showing only Osteopenia.  Continue Vit D supplement, Ca++ 1200 mg daily food/supplement together and regular weightbearing physical activity.  Repeat BD 05/2020.  Will decide on further management with that result.

## 2019-09-12 NOTE — Telephone Encounter (Signed)
Patient called because she has been taking Fosamax x 4 1/2 years and she said when Dr. Toney Rakes started her on it he told her she would probably be on it for 5 years.  She said she has started hearing popping noises coming from her jaw.  She said the medication insert says if you have any jawbone problems to contact your physician.  She said she is afraid to take anymore but wanted to check with you before stopping it.

## 2019-09-13 NOTE — Telephone Encounter (Signed)
Spoke with patient and informed her. She agrees with plan.

## 2019-09-15 DIAGNOSIS — I8311 Varicose veins of right lower extremity with inflammation: Secondary | ICD-10-CM | POA: Diagnosis not present

## 2019-09-15 DIAGNOSIS — I83813 Varicose veins of bilateral lower extremities with pain: Secondary | ICD-10-CM | POA: Diagnosis not present

## 2019-09-15 DIAGNOSIS — I8312 Varicose veins of left lower extremity with inflammation: Secondary | ICD-10-CM | POA: Diagnosis not present

## 2019-09-16 ENCOUNTER — Ambulatory Visit: Payer: Self-pay | Admitting: Obstetrics & Gynecology

## 2019-09-21 ENCOUNTER — Telehealth: Payer: Self-pay

## 2019-09-21 NOTE — Telephone Encounter (Signed)
There is no mention of heart failure. Please discuss with PCP

## 2019-09-21 NOTE — Telephone Encounter (Signed)
Received a call from patient and patient stated that she received an "end of visit summary" in the mail from her most previous visit and patient is anxious. Patient stated she was unaware that she had heart failure and would like a script to help "calm her nerves." I also offered to help set up Mychart for the patient so patient can message directly, but patient refused. Are you able to write patient something for her anxiety or does she need to refer to her PCP? Please advise. Thanks!

## 2019-09-21 NOTE — Telephone Encounter (Signed)
Error

## 2019-09-21 NOTE — Telephone Encounter (Signed)
Patient ID: [name], female    DOB: Sep 05, 1940, 79 y.o.   MRN: [id number]  Chief Complaint Patient presents with  Hypertension  Tricuspid Regurgitation  Follow-up   1 year  HPI:   Linda Barber  is a 79 y.o. African Amercian female with chronic diastolic heart failure, hypertension, prediabetes mellitus, hyper-lipidemia, mildly elevated LPA,  Raynaud's disease and moderate TR on echo 07/2018. This is what the patient is referring to. Would you like me to tell patient to refer to PCP for anxiety medication?

## 2019-09-22 DIAGNOSIS — E7800 Pure hypercholesterolemia, unspecified: Secondary | ICD-10-CM | POA: Diagnosis not present

## 2019-09-22 DIAGNOSIS — M818 Other osteoporosis without current pathological fracture: Secondary | ICD-10-CM | POA: Diagnosis not present

## 2019-09-22 DIAGNOSIS — E559 Vitamin D deficiency, unspecified: Secondary | ICD-10-CM | POA: Diagnosis not present

## 2019-09-22 DIAGNOSIS — E119 Type 2 diabetes mellitus without complications: Secondary | ICD-10-CM | POA: Diagnosis not present

## 2019-09-22 DIAGNOSIS — E038 Other specified hypothyroidism: Secondary | ICD-10-CM | POA: Diagnosis not present

## 2019-09-22 NOTE — Telephone Encounter (Signed)
Yes

## 2019-09-23 NOTE — Telephone Encounter (Signed)
Okay, will refer patient to PCP for anxiety medication. However, patient is still concerned about the mention of heart failure in her most recent encounter (see above). Please advise. Thanks!

## 2019-09-23 NOTE — Telephone Encounter (Signed)
Diastolic function means the heart has to relax to receive the blood so it can pump the blood out. If relaxation is impaired, then the blood coming to the heart is restricted and can cause dyspnea and reduced functional capacity and fluid build up. Exercise, weight loss, control of BP, salt restriction will improve this. This is a common condition and seen in patients >65 Years, Hypertension.

## 2019-09-29 DIAGNOSIS — I8311 Varicose veins of right lower extremity with inflammation: Secondary | ICD-10-CM | POA: Diagnosis not present

## 2019-09-29 DIAGNOSIS — I8312 Varicose veins of left lower extremity with inflammation: Secondary | ICD-10-CM | POA: Diagnosis not present

## 2019-10-11 DIAGNOSIS — I8311 Varicose veins of right lower extremity with inflammation: Secondary | ICD-10-CM | POA: Diagnosis not present

## 2019-10-11 DIAGNOSIS — I8312 Varicose veins of left lower extremity with inflammation: Secondary | ICD-10-CM | POA: Diagnosis not present

## 2019-10-11 DIAGNOSIS — I83813 Varicose veins of bilateral lower extremities with pain: Secondary | ICD-10-CM | POA: Diagnosis not present

## 2019-10-19 DIAGNOSIS — E559 Vitamin D deficiency, unspecified: Secondary | ICD-10-CM | POA: Diagnosis not present

## 2019-10-19 DIAGNOSIS — E119 Type 2 diabetes mellitus without complications: Secondary | ICD-10-CM | POA: Diagnosis not present

## 2019-10-19 DIAGNOSIS — E7800 Pure hypercholesterolemia, unspecified: Secondary | ICD-10-CM | POA: Diagnosis not present

## 2019-10-19 DIAGNOSIS — M818 Other osteoporosis without current pathological fracture: Secondary | ICD-10-CM | POA: Diagnosis not present

## 2019-10-19 DIAGNOSIS — E038 Other specified hypothyroidism: Secondary | ICD-10-CM | POA: Diagnosis not present

## 2019-10-19 DIAGNOSIS — G4709 Other insomnia: Secondary | ICD-10-CM | POA: Diagnosis not present

## 2019-10-28 DIAGNOSIS — I10 Essential (primary) hypertension: Secondary | ICD-10-CM | POA: Diagnosis not present

## 2019-11-15 DIAGNOSIS — I8311 Varicose veins of right lower extremity with inflammation: Secondary | ICD-10-CM | POA: Diagnosis not present

## 2019-11-16 DIAGNOSIS — I8311 Varicose veins of right lower extremity with inflammation: Secondary | ICD-10-CM | POA: Diagnosis not present

## 2019-11-16 DIAGNOSIS — I83811 Varicose veins of right lower extremities with pain: Secondary | ICD-10-CM | POA: Diagnosis not present

## 2019-11-18 DIAGNOSIS — E119 Type 2 diabetes mellitus without complications: Secondary | ICD-10-CM | POA: Diagnosis not present

## 2019-11-18 DIAGNOSIS — M818 Other osteoporosis without current pathological fracture: Secondary | ICD-10-CM | POA: Diagnosis not present

## 2019-11-18 DIAGNOSIS — Z0001 Encounter for general adult medical examination with abnormal findings: Secondary | ICD-10-CM | POA: Diagnosis not present

## 2019-11-18 DIAGNOSIS — G4709 Other insomnia: Secondary | ICD-10-CM | POA: Diagnosis not present

## 2019-11-18 DIAGNOSIS — E7800 Pure hypercholesterolemia, unspecified: Secondary | ICD-10-CM | POA: Diagnosis not present

## 2019-11-18 DIAGNOSIS — E559 Vitamin D deficiency, unspecified: Secondary | ICD-10-CM | POA: Diagnosis not present

## 2019-11-18 DIAGNOSIS — E038 Other specified hypothyroidism: Secondary | ICD-10-CM | POA: Diagnosis not present

## 2019-11-23 DIAGNOSIS — I8311 Varicose veins of right lower extremity with inflammation: Secondary | ICD-10-CM | POA: Diagnosis not present

## 2019-11-30 DIAGNOSIS — I8311 Varicose veins of right lower extremity with inflammation: Secondary | ICD-10-CM | POA: Diagnosis not present

## 2019-12-08 ENCOUNTER — Other Ambulatory Visit: Payer: Self-pay

## 2019-12-08 ENCOUNTER — Ambulatory Visit: Payer: Medicare PPO | Admitting: Nurse Practitioner

## 2019-12-08 ENCOUNTER — Encounter: Payer: Self-pay | Admitting: Nurse Practitioner

## 2019-12-08 VITALS — BP 126/84

## 2019-12-08 DIAGNOSIS — N898 Other specified noninflammatory disorders of vagina: Secondary | ICD-10-CM | POA: Diagnosis not present

## 2019-12-08 DIAGNOSIS — R300 Dysuria: Secondary | ICD-10-CM

## 2019-12-08 LAB — WET PREP FOR TRICH, YEAST, CLUE

## 2019-12-08 NOTE — Progress Notes (Signed)
° °  Acute Office Visit  Subjective:    Patient ID: Linda Barber, female    DOB: July 08, 1940, 79 y.o.   MRN: [id number]   HPI 79 y.o. presents today for dysuria that comes and goes. This is not a new problem for her. The burning is on the anterior vulva, worse during urination and after completing stream. She does not have symptoms today. She was taking pyridium as needed but her insurance no longer covers so she would like to see if there are any alternatives. Denies any vaginal symptoms.    Review of Systems  Constitutional: Negative.   Gastrointestinal: Negative.   Genitourinary: Positive for dysuria. Negative for frequency, hematuria, urgency and vaginal discharge.       Objective:    Physical Exam Constitutional:      Appearance: Normal appearance.  Abdominal:     General: Abdomen is flat.     Tenderness: There is no right CVA tenderness or left CVA tenderness.  Genitourinary:    General: Normal vulva.     Vagina: Normal.     BP 126/84  Wt Readings from Last 3 Encounters:  09/09/19 115 lb (52.2 kg)  05/03/19 115 lb (52.2 kg)  01/04/19 116 lb 6.4 oz (52.8 kg)   Wet prep negative  UA: trace blood, trace leukocytes, wbc 0-5, rbc 0-5     Assessment & Plan:   Problem List Items Addressed This Visit    None    Visit Diagnoses    Dysuria    -  Primary   Relevant Orders   Urinalysis,Complete w/RFL Culture   Vaginal irritation       Relevant Orders   WET PREP FOR [place], YEAST, CLUE     Plan: UA and wet prep unremarkable.  Urine culture pending. Normal exam findings. Discussed over the counter options such as AZO or Uristat for symptom relief but instructed not to use for more than 2 days at a time. I recommended a urology consult for continued dysuria but she wants to wait and will let us know if symptoms persist. Encouraged to drink lots of water and avoid acidic beverages such as soda, fruit juices, and coffee. She is agreeable to plan.      Tamela Gammon  [facility], 3:42 PM 12/08/2019

## 2019-12-08 NOTE — Patient Instructions (Signed)
Uristat over the counter  Dysuria Dysuria is pain or discomfort while urinating. The pain or discomfort may be felt in the part of your body that drains urine from the bladder (urethra) or in the surrounding tissue of the genitals. The pain may also be felt in the groin area, lower abdomen, or lower back. You may have to urinate frequently or have the sudden feeling that you have to urinate (urgency). Dysuria can affect both men and women, but it is more common in women. Dysuria can be caused by many different things, including:  Urinary tract infection.  Kidney stones or bladder stones.  Certain sexually transmitted infections (STIs), such as chlamydia.  Dehydration.  Inflammation of the tissues of the vagina.  Use of certain medicines.  Use of certain soaps or scented products that cause irritation. Follow these instructions at home: General instructions  Watch your condition for any changes.  Urinate often. Avoid holding urine for long periods of time.  After a bowel movement or urination, women should cleanse from front to back, using each tissue only once.  Urinate after sexual intercourse.  Keep all follow-up visits as told by your health care provider. This is important.  If you had any tests done to find the cause of dysuria, it is up to you to get your test results. Ask your health care provider, or the department that is doing the test, when your results will be ready. Eating and drinking   Drink enough fluid to keep your urine pale yellow.  Avoid caffeine, tea, and alcohol. They can irritate the bladder and make dysuria worse. In men, alcohol may irritate the prostate. Medicines  Take over-the-counter and prescription medicines only as told by your health care provider.  If you were prescribed an antibiotic medicine, take it as told by your health care provider. Do not stop taking the antibiotic even if you start to feel better. Contact a health care provider  if:  You have a fever.  You develop pain in your back or sides.  You have nausea or vomiting.  You have blood in your urine.  You are not urinating as often as you usually do. Get help right away if:  Your pain is severe and not relieved with medicines.  You cannot eat or drink without vomiting.  You are confused.  You have a rapid heartbeat while at rest.  You have shaking or chills.  You feel extremely weak. Summary  Dysuria is pain or discomfort while urinating. Many different conditions can lead to dysuria.  If you have dysuria, you may have to urinate frequently or have the sudden feeling that you have to urinate (urgency).  Watch your condition for any changes. Keep all follow-up visits as told by your health care provider.  Make sure that you urinate often and drink enough fluid to keep your urine pale yellow. This information is not intended to replace advice given to you by your health care provider. Make sure you discuss any questions you have with your health care provider. Document Revised: 04/17/2017 Document Reviewed: 02/19/2017 Elsevier Patient Education  [place].

## 2019-12-09 LAB — URINALYSIS, COMPLETE W/RFL CULTURE
Bilirubin Urine: NEGATIVE
Glucose, UA: NEGATIVE
Hyaline Cast: NONE SEEN /LPF
Ketones, ur: NEGATIVE
Nitrites, Initial: NEGATIVE
Protein, ur: NEGATIVE
Specific Gravity, Urine: 1.02 (ref 1.001–1.03)
pH: 5.5 (ref 5.0–8.0)

## 2019-12-09 LAB — URINE CULTURE
MICRO NUMBER:: 10737246
Result:: NO GROWTH
SPECIMEN QUALITY:: ADEQUATE

## 2019-12-09 LAB — CULTURE INDICATED

## 2019-12-12 DIAGNOSIS — Z1231 Encounter for screening mammogram for malignant neoplasm of breast: Secondary | ICD-10-CM | POA: Diagnosis not present

## 2019-12-14 ENCOUNTER — Encounter: Payer: Self-pay | Admitting: Anesthesiology

## 2019-12-16 DIAGNOSIS — I8311 Varicose veins of right lower extremity with inflammation: Secondary | ICD-10-CM | POA: Diagnosis not present

## 2019-12-28 DIAGNOSIS — I83812 Varicose veins of left lower extremities with pain: Secondary | ICD-10-CM | POA: Diagnosis not present

## 2019-12-28 DIAGNOSIS — I8312 Varicose veins of left lower extremity with inflammation: Secondary | ICD-10-CM | POA: Diagnosis not present

## 2019-12-30 ENCOUNTER — Encounter: Payer: Self-pay | Admitting: Obstetrics & Gynecology

## 2019-12-30 DIAGNOSIS — R921 Mammographic calcification found on diagnostic imaging of breast: Secondary | ICD-10-CM | POA: Diagnosis not present

## 2020-01-09 DIAGNOSIS — M7981 Nontraumatic hematoma of soft tissue: Secondary | ICD-10-CM | POA: Diagnosis not present

## 2020-01-09 DIAGNOSIS — I8312 Varicose veins of left lower extremity with inflammation: Secondary | ICD-10-CM | POA: Diagnosis not present

## 2020-01-18 NOTE — Progress Notes (Signed)
Echocardiogram 10/28/2019: Normal LV systolic function, EF 60 to 65%.  Essentially normal echocardiogram without significant valvular abnormality.

## 2020-02-08 ENCOUNTER — Encounter: Payer: Self-pay | Admitting: Nurse Practitioner

## 2020-02-08 ENCOUNTER — Ambulatory Visit: Payer: Medicare PPO | Admitting: Nurse Practitioner

## 2020-02-08 ENCOUNTER — Other Ambulatory Visit: Payer: Self-pay

## 2020-02-08 VITALS — BP 120/74

## 2020-02-08 DIAGNOSIS — R300 Dysuria: Secondary | ICD-10-CM | POA: Diagnosis not present

## 2020-02-08 DIAGNOSIS — R1084 Generalized abdominal pain: Secondary | ICD-10-CM | POA: Diagnosis not present

## 2020-02-08 LAB — URINALYSIS, COMPLETE W/RFL CULTURE
Bacteria, UA: NONE SEEN /HPF
Bilirubin Urine: NEGATIVE
Glucose, UA: NEGATIVE
Hgb urine dipstick: NEGATIVE
Hyaline Cast: NONE SEEN /LPF
Ketones, ur: NEGATIVE
Leukocyte Esterase: NEGATIVE
Nitrites, Initial: NEGATIVE
Protein, ur: NEGATIVE
RBC / HPF: NONE SEEN /HPF (ref 0–2)
Specific Gravity, Urine: 1.007 (ref 1.001–1.03)
WBC, UA: NONE SEEN /HPF (ref 0–5)
pH: 6 (ref 5.0–8.0)

## 2020-02-08 LAB — NO CULTURE INDICATED

## 2020-02-08 NOTE — Progress Notes (Signed)
   Acute Office Visit  Subjective:    Patient ID: Linda Barber, female    DOB: 1940/11/19, 79 y.o.   MRN: [id number]   HPI 79 y.o. presents today for dysuria that is not new for her and has been ongoing for many years with unknown cause. This comes and goes and is worse at night and is usually accompanied by urgency. She was taking Pyridium as needed for years with some relief but says insurance will not cover anymore, so it was recommended she try Uristat or AZO over the counter but should not be used for than a couple of days at a time. She started Uristat one week ago and has taken 3 doses total at nighttime with minimal relief. She began having abdominal pain that is described as cramp-like and generalized and has improved some. She took Ibuprofen and Pepto with relief. When her bowels began to move her stool was small and hard. She drinks 8-10 glasses of water per day. Last visit we discussed a urology consult and she would like to move forward with this. Denies vaginal symptoms.    Review of Systems  Constitutional: Negative.   Gastrointestinal: Positive for abdominal pain and constipation. Negative for nausea and vomiting.  Genitourinary: Positive for dysuria and urgency. Negative for frequency, hematuria, vaginal discharge and vaginal pain.       Objective:    Physical Exam Constitutional:      General: She is not in acute distress.    Appearance: Normal appearance.  Abdominal:     General: Bowel sounds are normal.     Tenderness: There is no abdominal tenderness. There is no right CVA tenderness, left CVA tenderness, guarding or rebound.  Genitourinary:    General: Normal vulva.     Vagina: Normal.     BP 120/74  Wt Readings from Last 3 Encounters:  09/09/19 115 lb (52.2 kg)  05/03/19 115 lb (52.2 kg)  01/04/19 116 lb 6.4 oz (52.8 kg)   UA negative     Assessment & Plan:   Problem List Items Addressed This Visit    None    Visit Diagnoses    Dysuria    -   Primary   Relevant Orders   Urinalysis,Complete w/RFL Culture   Generalized abdominal pain          Plan: UA unremarkable. Again recommended urology consult for evaluation of chronic dysuria with unknown cause. She is agreeable. Recommend using Uristat minimally until appointment with urology since it is not helping much anyway. Encouraged to continue drinking lots of water but stopping a few hours before bed to avoid excessive nighttime urination. Avoid acidic beverages such as coffee, soda, and fruit juices. Her abdominal pain was most likely caused by constipation. This is improving and she sees her PCP in 1-2 weeks and she will mention this then. Recommend stool softener as needed. She is agreeable to plan.     Tamela Gammon [facility], 10:12 AM 02/08/2020

## 2020-02-09 ENCOUNTER — Telehealth: Payer: Self-pay | Admitting: *Deleted

## 2020-02-09 NOTE — Telephone Encounter (Signed)
Office notes faxed to [facility] and placed in [place] work queue they will call to schedule.

## 2020-02-09 NOTE — Telephone Encounter (Signed)
-----   Message from Tamela Gammon, NP sent at 02/08/2020 10:13 AM EDT ----- Please send referral to urology for chronic dysuria of unknown case. Thank you

## 2020-02-13 DIAGNOSIS — G4709 Other insomnia: Secondary | ICD-10-CM | POA: Diagnosis not present

## 2020-02-13 DIAGNOSIS — I10 Essential (primary) hypertension: Secondary | ICD-10-CM | POA: Diagnosis not present

## 2020-02-13 DIAGNOSIS — E559 Vitamin D deficiency, unspecified: Secondary | ICD-10-CM | POA: Diagnosis not present

## 2020-02-13 DIAGNOSIS — M818 Other osteoporosis without current pathological fracture: Secondary | ICD-10-CM | POA: Diagnosis not present

## 2020-02-13 DIAGNOSIS — E119 Type 2 diabetes mellitus without complications: Secondary | ICD-10-CM | POA: Diagnosis not present

## 2020-02-13 DIAGNOSIS — E7800 Pure hypercholesterolemia, unspecified: Secondary | ICD-10-CM | POA: Diagnosis not present

## 2020-02-22 ENCOUNTER — Ambulatory Visit: Payer: Medicare PPO | Admitting: Cardiology

## 2020-02-22 ENCOUNTER — Other Ambulatory Visit: Payer: Self-pay

## 2020-02-22 ENCOUNTER — Encounter: Payer: Self-pay | Admitting: Cardiology

## 2020-02-22 VITALS — BP 162/78 | HR 97 | Resp 16 | Ht 60.0 in | Wt 120.0 lb

## 2020-02-22 DIAGNOSIS — I10 Essential (primary) hypertension: Secondary | ICD-10-CM | POA: Diagnosis not present

## 2020-02-22 DIAGNOSIS — I7300 Raynaud's syndrome without gangrene: Secondary | ICD-10-CM

## 2020-02-22 NOTE — Progress Notes (Signed)
Primary Physician/Referring:  Trey Sailors, PA  Patient ID: Linda Barber, female    DOB: Mar 13, 1941, 79 y.o.   MRN: [id number]  Chief Complaint  Patient presents with  . Follow-up    6 month  . Results    Echo   HPI:    FLOYCE BUJAK  is a 79 y.o. African Amercian female with chronic diastolic heart failure, hypertension, prediabetes mellitus, hyper-lipidemia, mildly elevated LPA,  Raynaud's disease and mild to moderate TR on echo 07/2018.   She remains asymptomatic, presents here for 6 month visit.  On her last office visit I put in my differentials that she may have diastolic heart failure as etiology for mild to moderate TR.  She got concerned about this and had echocardiogram performed and wanted to see me sooner than 1 year later.  No other new symptoms.  She is tolerating all her medications well and states that her blood pressure has been well controlled.  Since being on amlodipine, she has not had significant Raynaud's disease symptoms.  Past Medical History:  Diagnosis Date  . Cancer ([place]) 2008   VULVAR CARCINOMA INSITU  . Costochondritis   . Diabetes mellitus without complication ([place])    pt. reports borderline   . Diverticulosis   . High cholesterol   . Hypertension   . Osteopenia   . Osteoporosis   . 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 Shingles    Past Surgical History:  Procedure Laterality Date  . ABDOMINAL HYSTERECTOMY    . CATARACT EXTRACTION    . CHOLECYSTECTOMY N/A 05/03/2014   Procedure: LAPAROSCOPIC CHOLECYSTECTOMY WITH INTRAOPERATIVE CHOLANGIOGRAM;  Surgeon: Autumn Messing III, MD;  Location: [facility];  Service: General;  Laterality: N/A;  . COLONOSCOPY  December 16 2012  . HEMORRHOID SURGERY  2014  . HEMORROIDECTOMY     Social History   Tobacco Use  . Smoking status: Never Smoker  . Smokeless tobacco: Never Used  Substance Use Topics  . Alcohol use: No    Alcohol/week: 0.0 standard drinks   Marital Status: Widowed  ROS  Review of Systems   Cardiovascular: Negative for chest pain, dyspnea on exertion and leg swelling.  Gastrointestinal: Negative for melena.   Objective  Blood pressure (!) 162/78, pulse 97, resp. rate 16, height 5' (1.524 m), weight 120 lb (54.4 kg), SpO2 97 %.  Vitals with BMI 02/22/2020 02/22/2020 02/08/2020  Height - $Remove'5\' 0"'GgudUPG$  -  Weight - 120 lbs -  BMI - 24.09 -  Systolic 735 329 924  Diastolic 78 81 74  Pulse 97 91 -     Physical Exam Cardiovascular:     Rate and Rhythm: Normal rate and regular rhythm.     Pulses: Intact distal pulses.     Heart sounds: Normal heart sounds. No murmur heard.  No gallop.      Comments: No leg edema, no JVD. Pulmonary:     Effort: Pulmonary effort is normal.     Breath sounds: Normal breath sounds.  Abdominal:     General: Bowel sounds are normal.     Palpations: Abdomen is soft.    Laboratory examination:   No results for input(s): NA, K, CL, CO2, GLUCOSE, BUN, CREATININE, CALCIUM, GFRNONAA, GFRAA in the last 8760 hours. CrCl cannot be calculated (Patient's most recent lab result is older than the maximum 21 days allowed.).  CMP Latest Ref Rng & Units 04/26/2014  Glucose 70 - 99 mg/dL 91  BUN 6 - 23 mg/dL 15  Creatinine 0.50 - 1.10 mg/dL 0.66  Sodium 137 - 147 mEq/L 136(L)  Potassium 3.7 - 5.3 mEq/L 4.4  Chloride 96 - 112 mEq/L 100  CO2 19 - 32 mEq/L 23  Calcium 8.4 - 10.5 mg/dL 10.2  Total Protein 6.0 - 8.3 g/dL 8.1  Total Bilirubin 0.3 - 1.2 mg/dL 0.3  Alkaline Phos 39 - 117 U/L 64  AST 0 - 37 U/L 27  ALT 0 - 35 U/L 21   CBC Latest Ref Rng & Units 04/26/2014  WBC 4.0 - 10.5 K/uL 5.1  Hemoglobin 12.0 - 15.0 g/dL 13.3  Hematocrit 36 - 46 % 41.5  Platelets 150 - 400 K/uL 235   External labs:   07/05/18: LPA 95   04/22/2018: Creatinine 0.76, EGFR 76/88, potassium 5.4, CMP otherwise normal.  Cholesterol 123, triglycerides 38, HDL 69, LDL 46.  Hemoglobin A1c 5.6%.  CBC normal.  TSH normal.  Medications and allergies   Allergies  Allergen Reactions  .  Ampicillin Nausea And Vomiting    Dizzines   . Doxycycline Hyclate   . Lamisil [Terbinafine]     numbness  . Codeine Nausea And Vomiting     Current Outpatient Medications  Medication Instructions  . Accu-Chek FastClix Lancets MISC USE LANCETS TO CHECK BLOOD SUGAR TID  . ACCU-CHEK GUIDE test strip USE TEST STRIPS TO CHECK BLOOD SUGAR TID  . amLODipine (NORVASC) 10 mg, Oral, Daily  . aspirin 81 mg, Daily  . cholecalciferol (VITAMIN D) 2,000 Units, Oral, Daily  . co-enzyme Q-10 100 mg, Oral, Daily  . fish oil-omega-3 fatty acids 1 g, Oral, 2 times daily  . losartan (COZAAR) 100 mg, Oral, Daily, (2) $RemoveBe'50mg'fJJmNGzki$  tablets daily  . mirtazapine (REMERON) 7.5 mg, Oral, Daily at bedtime  . Multiple Vitamin (MULTIVITAMIN) capsule 1 capsule, Oral, Daily  . niacin (NIASPAN) 1,000 mg, Oral, Daily  . Polyvinyl Alcohol-Povidone (REFR[place] OP) Ophthalmic  . Probiotic Product ([employer] SENIOR PROBIOTIC) CAPS Oral, Daily  . simvastatin (ZOCOR) 10 mg, Oral, Daily at bedtime  . thiothixene (NAVANE) 2 mg  . valACYclovir (VALTREX) 500 mg, Oral, Daily, Prophylaxis  . vitamin B-12 (CYANOCOBALAMIN) 1,000 mcg, Oral, Daily    Radiology:  No results found.  Cardiac Studies:   Carotid artery duplex  07/05/2018:  Minimal soft plaque bilateral carotid arteries. Antegrade right vertebral artery flow. Antegrade left vertebral artery flow.  Echocardiogram 10/28/2019:  Normal LV systolic function, EF 60 to 65%. Essentially normal echocardiogram without significant valvular abnormality. Compared to 07/20/2018, mild to moderate tricuspid valve regurgitation not present.   EKG:  EKG 09/06/2019: Sinus rhythm with short PR interval at the rate of 77 bpm, right atrial enlargement, normal axis.  Incomplete right bundle branch block.  Otherwise normal EKG.  No significant change from EKG 07/01/2018.  Assessment     ICD-10-CM   1. Essential hypertension  I10   2. Raynaud's phenomenon without gangrene  I73.00    No orders of  the defined types were placed in this encounter.   Medications Discontinued During This Encounter  Medication Reason  . alendronate (FOSAMAX) 70 MG tablet Side effect (s)     Recommendations:   TONYA WANTZ  is a 79 y.o.  African Amercian female with chronic diastolic heart failure, hypertension, prediabetes mellitus, hyper-lipidemia, mildly elevated LPA,  Raynaud's disease and mild moderate TR on echo 07/2018.   She remains asymptomatic.  I seen the patient 6 months ago, due to mild to moderate tricuspid regurgitation without pulmonary hypertension, I suspected may be  diastolic heart failure to be the etiology for valvular heart disease.  Patient has been concerned about heart failure, she had repeat echocardiogram which essentially is normal.  I again reassured her and advised her that she does not have any significant valvular abnormality.  She is presently doing well and has maintained weight loss.  No clinical evidence of heart failure.  Her blood pressure was elevated today, but her pressures at home have been extremely well controlled and I will be on 931 mmHg systolic.  She seems anxious and hence I did not make any changes to her medication.  Indeed if her blood pressure remains high, could consider addition of bisoprolol 5 to 10 mg daily which will also decrease her heart rate and improve diastolic compliance without causing adverse effect due to Raynaud's disease.  Otherwise stable from cardiac standpoint, I will see her back on a as needed basis.

## 2020-03-08 DIAGNOSIS — E119 Type 2 diabetes mellitus without complications: Secondary | ICD-10-CM | POA: Diagnosis not present

## 2020-03-08 DIAGNOSIS — G4709 Other insomnia: Secondary | ICD-10-CM | POA: Diagnosis not present

## 2020-03-08 DIAGNOSIS — M818 Other osteoporosis without current pathological fracture: Secondary | ICD-10-CM | POA: Diagnosis not present

## 2020-03-08 DIAGNOSIS — R220 Localized swelling, mass and lump, head: Secondary | ICD-10-CM | POA: Diagnosis not present

## 2020-03-08 DIAGNOSIS — E559 Vitamin D deficiency, unspecified: Secondary | ICD-10-CM | POA: Diagnosis not present

## 2020-03-08 DIAGNOSIS — I10 Essential (primary) hypertension: Secondary | ICD-10-CM | POA: Diagnosis not present

## 2020-03-08 DIAGNOSIS — E7800 Pure hypercholesterolemia, unspecified: Secondary | ICD-10-CM | POA: Diagnosis not present

## 2020-03-14 NOTE — Telephone Encounter (Signed)
Patient said she is not longer wanting referral and is not longer having dysuria. Patient did not wish to proceed with referral.

## 2020-03-15 DIAGNOSIS — E559 Vitamin D deficiency, unspecified: Secondary | ICD-10-CM | POA: Diagnosis not present

## 2020-03-15 DIAGNOSIS — E119 Type 2 diabetes mellitus without complications: Secondary | ICD-10-CM | POA: Diagnosis not present

## 2020-03-15 DIAGNOSIS — E7800 Pure hypercholesterolemia, unspecified: Secondary | ICD-10-CM | POA: Diagnosis not present

## 2020-03-15 DIAGNOSIS — M818 Other osteoporosis without current pathological fracture: Secondary | ICD-10-CM | POA: Diagnosis not present

## 2020-03-15 DIAGNOSIS — I10 Essential (primary) hypertension: Secondary | ICD-10-CM | POA: Diagnosis not present

## 2020-03-15 DIAGNOSIS — G4709 Other insomnia: Secondary | ICD-10-CM | POA: Diagnosis not present

## 2020-03-19 DIAGNOSIS — H524 Presbyopia: Secondary | ICD-10-CM | POA: Diagnosis not present

## 2020-03-19 DIAGNOSIS — E119 Type 2 diabetes mellitus without complications: Secondary | ICD-10-CM | POA: Diagnosis not present

## 2020-03-19 DIAGNOSIS — Z961 Presence of intraocular lens: Secondary | ICD-10-CM | POA: Diagnosis not present

## 2020-03-19 DIAGNOSIS — D3101 Benign neoplasm of right conjunctiva: Secondary | ICD-10-CM | POA: Diagnosis not present

## 2020-04-02 ENCOUNTER — Other Ambulatory Visit: Payer: Self-pay | Admitting: *Deleted

## 2020-04-02 DIAGNOSIS — M8589 Other specified disorders of bone density and structure, multiple sites: Secondary | ICD-10-CM

## 2020-04-19 DIAGNOSIS — F39 Unspecified mood [affective] disorder: Secondary | ICD-10-CM | POA: Diagnosis not present

## 2020-04-19 DIAGNOSIS — E7800 Pure hypercholesterolemia, unspecified: Secondary | ICD-10-CM | POA: Diagnosis not present

## 2020-04-19 DIAGNOSIS — E039 Hypothyroidism, unspecified: Secondary | ICD-10-CM | POA: Diagnosis not present

## 2020-04-19 DIAGNOSIS — I10 Essential (primary) hypertension: Secondary | ICD-10-CM | POA: Diagnosis not present

## 2020-04-19 DIAGNOSIS — M818 Other osteoporosis without current pathological fracture: Secondary | ICD-10-CM | POA: Diagnosis not present

## 2020-04-19 DIAGNOSIS — E119 Type 2 diabetes mellitus without complications: Secondary | ICD-10-CM | POA: Diagnosis not present

## 2020-04-19 DIAGNOSIS — E559 Vitamin D deficiency, unspecified: Secondary | ICD-10-CM | POA: Diagnosis not present

## 2020-04-19 DIAGNOSIS — G4709 Other insomnia: Secondary | ICD-10-CM | POA: Diagnosis not present

## 2020-05-03 ENCOUNTER — Encounter: Payer: Self-pay | Admitting: Obstetrics & Gynecology

## 2020-05-03 ENCOUNTER — Other Ambulatory Visit: Payer: Self-pay

## 2020-05-03 ENCOUNTER — Ambulatory Visit: Payer: Medicare Other | Admitting: Obstetrics & Gynecology

## 2020-05-03 VITALS — BP 126/78 | Ht 59.75 in | Wt 122.0 lb

## 2020-05-03 DIAGNOSIS — Z01419 Encounter for gynecological examination (general) (routine) without abnormal findings: Secondary | ICD-10-CM

## 2020-05-03 DIAGNOSIS — Z90710 Acquired absence of both cervix and uterus: Secondary | ICD-10-CM | POA: Diagnosis not present

## 2020-05-03 DIAGNOSIS — M8589 Other specified disorders of bone density and structure, multiple sites: Secondary | ICD-10-CM

## 2020-05-03 DIAGNOSIS — Z780 Asymptomatic menopausal state: Secondary | ICD-10-CM

## 2020-05-03 NOTE — Progress Notes (Signed)
Linda Barber May 05, 1941 [id number]   History:    79 y.o. G2P2L2   LT:[id number] presenting for annual gyn exam   HLK:[id number], well on no hormone replacement therapy. History of total abdominal hysterectomy. No pelvic pain. Abstinent. No HSV recurrence x last year. History of VIN 3. Status post wide excision with negative margins many years ago. Osteopenia on last BD 05/2018, significantly increased on Fosamax, Fosamax stopped. Bowel movements normal. Breasts normal. Body mass index 24.03. Health labs with family physician.   Past medical history,surgical history, family history and social history were all reviewed and documented in the EPIC chart.  Gynecologic History No LMP recorded. Patient has had a hysterectomy.  Obstetric History OB History  Gravida Para Term Preterm AB Living  2 2       2   SAB IAB Ectopic Multiple Live Births               # Outcome Date GA Lbr Len/2nd Weight Sex Delivery Anes PTL Lv  2 Para           1 Para              ROS: A ROS was performed and pertinent positives and negatives are included in the history.  GENERAL: No fevers or chills. HEENT: No change in vision, no earache, sore throat or sinus congestion. NECK: No pain or stiffness. CARDIOVASCULAR: No chest pain or pressure. No palpitations. PULMONARY: No shortness of breath, cough or wheeze. GASTROINTESTINAL: No abdominal pain, nausea, vomiting or diarrhea, melena or bright red blood per rectum. GENITOURINARY: No urinary frequency, urgency, hesitancy or dysuria. MUSCULOSKELETAL: No joint or muscle pain, no back pain, no recent trauma. DERMATOLOGIC: No rash, no itching, no lesions. ENDOCRINE: No polyuria, polydipsia, no heat or cold intolerance. No recent change in weight. HEMATOLOGICAL: No anemia or easy bruising or bleeding. NEUROLOGIC: No headache, seizures, numbness, tingling or weakness. PSYCHIATRIC: No depression, no loss of interest in normal activity or change in  sleep pattern.     Exam:   BP 126/78   Ht 4' 11.75" (1.518 m)   Wt 122 lb (55.3 kg)   BMI 24.03 kg/m   Body mass index is 24.03 kg/m.  General appearance : Well developed well nourished female. No acute distress HEENT: Eyes: no retinal hemorrhage or exudates,  Neck supple, trachea midline, no carotid bruits, no thyroidmegaly Lungs: Clear to auscultation, no rhonchi or wheezes, or rib retractions  Heart: Regular rate and rhythm, no murmurs or gallops Breast:Examined in sitting and supine position were symmetrical in appearance, no palpable masses or tenderness,  no skin retraction, no nipple inversion, no nipple discharge, no skin discoloration, no axillary or supraclavicular lymphadenopathy Abdomen: no palpable masses or tenderness, no rebound or guarding Extremities: no edema or skin discoloration or tenderness  Pelvic: Vulva: Normal             Vagina: No gross lesions or discharge  Cervix/Uterus absent  Adnexa  Without masses or tenderness  Anus: Normal   Assessment/Plan:  79 y.o. female for annual exam   1. Well female exam with routine gynecological exam Gynecologic exam status post TAH and menopause.  No indication for Pap test at this time.  Breast exam normal.  Screening mammogram August 2021 was negative.  Colonoscopy 2016.  Body mass index good at 24.03.  Continue with fitness and healthy nutrition.  Health labs with family PA.  2. S/P TAH (total abdominal hysterectomy)  3. Postmenopausal Well on no HRT.  4. Osteopenia of multiple sites BD scheduled 05/2020.  Other orders - metoprolol tartrate (LOPRESSOR) 25 MG tablet; Take 25 mg by mouth 2 (two) times daily. - levothyroxine (SYNTHROID) 25 MCG tablet; Take 25 mcg by mouth daily before breakfast. - metFORMIN (GLUCOPHAGE) 500 MG tablet; Take by mouth 2 (two) times daily with a meal.  [name] MD, 2:13 PM 05/03/2020

## 2020-05-07 DIAGNOSIS — I8311 Varicose veins of right lower extremity with inflammation: Secondary | ICD-10-CM | POA: Diagnosis not present

## 2020-05-07 DIAGNOSIS — I8312 Varicose veins of left lower extremity with inflammation: Secondary | ICD-10-CM | POA: Diagnosis not present

## 2020-05-07 DIAGNOSIS — I83893 Varicose veins of bilateral lower extremities with other complications: Secondary | ICD-10-CM | POA: Diagnosis not present

## 2020-05-14 ENCOUNTER — Encounter: Payer: Self-pay | Admitting: Obstetrics & Gynecology

## 2020-05-29 ENCOUNTER — Other Ambulatory Visit: Payer: Self-pay

## 2020-05-29 ENCOUNTER — Ambulatory Visit (INDEPENDENT_AMBULATORY_CARE_PROVIDER_SITE_OTHER): Payer: Medicare PPO

## 2020-05-29 ENCOUNTER — Other Ambulatory Visit: Payer: Self-pay | Admitting: Obstetrics & Gynecology

## 2020-05-29 DIAGNOSIS — Z780 Asymptomatic menopausal state: Secondary | ICD-10-CM

## 2020-05-29 DIAGNOSIS — M8589 Other specified disorders of bone density and structure, multiple sites: Secondary | ICD-10-CM

## 2020-05-31 DIAGNOSIS — F39 Unspecified mood [affective] disorder: Secondary | ICD-10-CM | POA: Diagnosis not present

## 2020-05-31 DIAGNOSIS — M818 Other osteoporosis without current pathological fracture: Secondary | ICD-10-CM | POA: Diagnosis not present

## 2020-05-31 DIAGNOSIS — E039 Hypothyroidism, unspecified: Secondary | ICD-10-CM | POA: Diagnosis not present

## 2020-05-31 DIAGNOSIS — E119 Type 2 diabetes mellitus without complications: Secondary | ICD-10-CM | POA: Diagnosis not present

## 2020-05-31 DIAGNOSIS — G4709 Other insomnia: Secondary | ICD-10-CM | POA: Diagnosis not present

## 2020-05-31 DIAGNOSIS — E559 Vitamin D deficiency, unspecified: Secondary | ICD-10-CM | POA: Diagnosis not present

## 2020-05-31 DIAGNOSIS — E7800 Pure hypercholesterolemia, unspecified: Secondary | ICD-10-CM | POA: Diagnosis not present

## 2020-05-31 DIAGNOSIS — I10 Essential (primary) hypertension: Secondary | ICD-10-CM | POA: Diagnosis not present

## 2020-07-09 ENCOUNTER — Other Ambulatory Visit: Payer: Self-pay | Admitting: Obstetrics & Gynecology

## 2020-07-10 ENCOUNTER — Encounter: Payer: Self-pay | Admitting: Obstetrics & Gynecology

## 2020-07-12 NOTE — Telephone Encounter (Signed)
Medication refill request: Valtrex  Last AEX:  05-03-20 ML  Next AEX: not scheduled at this time  Last MMG (if hormonal medication request): n/a Refill authorized: Today, please advise.   Medication pended for #90, 4RF. Please refill if appropriate.

## 2020-08-21 ENCOUNTER — Other Ambulatory Visit: Payer: Medicare PPO

## 2020-09-07 ENCOUNTER — Ambulatory Visit: Payer: Medicare PPO | Admitting: Cardiology

## 2020-12-13 ENCOUNTER — Encounter: Payer: Self-pay | Admitting: Obstetrics & Gynecology

## 2021-03-19 DIAGNOSIS — Z961 Presence of intraocular lens: Secondary | ICD-10-CM | POA: Diagnosis not present

## 2021-03-19 DIAGNOSIS — H524 Presbyopia: Secondary | ICD-10-CM | POA: Diagnosis not present

## 2021-03-19 DIAGNOSIS — E119 Type 2 diabetes mellitus without complications: Secondary | ICD-10-CM | POA: Diagnosis not present

## 2021-03-19 DIAGNOSIS — D3101 Benign neoplasm of right conjunctiva: Secondary | ICD-10-CM | POA: Diagnosis not present

## 2021-03-19 DIAGNOSIS — Z7984 Long term (current) use of oral hypoglycemic drugs: Secondary | ICD-10-CM | POA: Diagnosis not present

## 2021-03-22 DIAGNOSIS — G4709 Other insomnia: Secondary | ICD-10-CM | POA: Diagnosis not present

## 2021-03-22 DIAGNOSIS — M818 Other osteoporosis without current pathological fracture: Secondary | ICD-10-CM | POA: Diagnosis not present

## 2021-03-22 DIAGNOSIS — R220 Localized swelling, mass and lump, head: Secondary | ICD-10-CM | POA: Diagnosis not present

## 2021-03-22 DIAGNOSIS — E7800 Pure hypercholesterolemia, unspecified: Secondary | ICD-10-CM | POA: Diagnosis not present

## 2021-03-22 DIAGNOSIS — E038 Other specified hypothyroidism: Secondary | ICD-10-CM | POA: Diagnosis not present

## 2021-03-22 DIAGNOSIS — E119 Type 2 diabetes mellitus without complications: Secondary | ICD-10-CM | POA: Diagnosis not present

## 2021-03-22 DIAGNOSIS — E559 Vitamin D deficiency, unspecified: Secondary | ICD-10-CM | POA: Diagnosis not present

## 2021-03-24 ENCOUNTER — Other Ambulatory Visit: Payer: Self-pay

## 2021-03-24 ENCOUNTER — Emergency Department (HOSPITAL_COMMUNITY)
Admission: EM | Admit: 2021-03-24 | Discharge: 2021-03-24 | Disposition: A | Discharge status: Home / Self Care | Payer: Medicare PPO | Attending: Emergency Medicine | Admitting: Emergency Medicine

## 2021-03-24 ENCOUNTER — Encounter (HOSPITAL_COMMUNITY): Payer: Self-pay | Admitting: *Deleted

## 2021-03-24 DIAGNOSIS — R220 Localized swelling, mass and lump, head: Secondary | ICD-10-CM | POA: Diagnosis not present

## 2021-03-24 DIAGNOSIS — Z8544 Personal history of malignant neoplasm of other female genital organs: Secondary | ICD-10-CM | POA: Insufficient documentation

## 2021-03-24 DIAGNOSIS — Z79899 Other long term (current) drug therapy: Secondary | ICD-10-CM | POA: Insufficient documentation

## 2021-03-24 DIAGNOSIS — I10 Essential (primary) hypertension: Secondary | ICD-10-CM | POA: Diagnosis not present

## 2021-03-24 DIAGNOSIS — Z7984 Long term (current) use of oral hypoglycemic drugs: Secondary | ICD-10-CM | POA: Insufficient documentation

## 2021-03-24 DIAGNOSIS — K047 Periapical abscess without sinus: Secondary | ICD-10-CM | POA: Diagnosis not present

## 2021-03-24 DIAGNOSIS — Z7982 Long term (current) use of aspirin: Secondary | ICD-10-CM | POA: Insufficient documentation

## 2021-03-24 DIAGNOSIS — E119 Type 2 diabetes mellitus without complications: Secondary | ICD-10-CM | POA: Insufficient documentation

## 2021-03-24 MED ORDER — CHLORHEXIDINE GLUCONATE 0.12 % MT SOLN: 15.0000 mL | Freq: Two times a day (BID) | OROMUCOSAL | 0 refills | Status: DC

## 2021-03-24 NOTE — ED Triage Notes (Addendum)
Pt c/o facial swelling to R cheek x 3 days after receiving the flu shot. Pt reports similar happened last year with Covid shot. Pt seen her PCP on Friday for same, prescribed Prednisone and Clindamycin. Pt noticed blood in her mouth this morning. Denies taking blood thinners. Pt thinks she may have accidentally switched her daytime and nighttime meds

## 2021-03-24 NOTE — Discharge Instructions (Signed)
Follow-up with your primary care doctor and dentist as discussed.  Return to the emergency room if you have any worsening symptoms.

## 2021-03-24 NOTE — ED Provider Notes (Signed)
[facility] EMERGENCY DEPARTMENT Provider Note   CSN: [id number] Arrival date & time: 03/24/21  0539     History Chief Complaint  Patient presents with   Facial Swelling    Linda Barber is a 80 y.o. female.  Patient is a 80 year old female who presents with some swelling to her right face.  She notes a 3 to 4-day history of some swelling and puffiness to her right face just above her teeth.  She said this happened about a year ago after she got a COVID-vaccine.  She attributed to the COVID-vaccine but she had improvement with prednisone and clindamycin for possible tooth infection.  She says it started back again about 3 to 4 days ago.  Its the same symptoms she has had before.  She initially had some tenderness to her upper gums but that seems to have improved after she started clindamycin and prednisone again 2 days ago.  This was started after an evaluation by her PCP.  She said her overall her symptoms have improved.  Today she noticed a little bit of bleeding from her upper gum.  She has no nosebleeds.  No numbness to her face.  No vision changes.  No swelling to her lips or tongue.  No rash.  No shortness of breath.  Previously when it happened a year ago she did see a dentist and they did not find any evidence of dental disease that would have caused her symptoms.      Past Medical History:  Diagnosis Date   Cancer ([place]) 2008   VULVAR CARCINOMA INSITU   Costochondritis    Diabetes mellitus without complication ([place])    pt. reports borderline    Diverticulosis    High cholesterol    Hypertension    Osteopenia    Osteoporosis    Reynolds syndrome [facility])    Shingles     Patient Active Problem List   Diagnosis Date Noted   Raynaud's phenomenon 09/10/2018   Elevated Lp(a) 09/10/2018   Essential hypertension 07/01/2018   Hyperlipemia 07/01/2018   Tachycardia 07/01/2018   Osteoporosis 06/19/2015   VIN III (vulvar intraepithelial neoplasia III)  04/05/2013   Vaginal atrophy 04/05/2013    Past Surgical History:  Procedure Laterality Date   ABDOMINAL HYSTERECTOMY     CATARACT EXTRACTION     CHOLECYSTECTOMY N/A 05/03/2014   Procedure: LAPAROSCOPIC CHOLECYSTECTOMY WITH INTRAOPERATIVE CHOLANGIOGRAM;  Surgeon: Autumn Messing III, MD;  Location: [place];  Service: General;  Laterality: N/A;   COLONOSCOPY  December 16 2012   [place]  2014   HEMORROIDECTOMY       OB History     Gravida  2   Para  2   Term      Preterm      AB      Living  2      SAB      IAB      Ectopic      Multiple      Live Births              Family History  Problem Relation Age of Onset   Hypertension Mother    Hypertension Sister    Hypertension Brother    Heart attack Brother    Diabetes Cousin     Social History   Tobacco Use   Smoking status: Never   Smokeless tobacco: Never  Vaping Use   Vaping Use: Never used  Substance Use Topics   Alcohol  use: No    Alcohol/week: 0.0 standard drinks   Drug use: No    Home Medications Prior to Admission medications   Medication Sig Start Date End Date Taking? Authorizing Provider  chlorhexidine (PERIDEX) 0.12 % solution Use as directed 15 mLs in the mouth or throat 2 (two) times daily. After brushing your teeth, swish around 29ml in your mouth and spit it out.  Use twice daily for 7 days. 03/24/21  Yes Malvin Johns, MD  Accu-Chek FastClix Lancets MISC USE LANCETS TO CHECK BLOOD SUGAR TID 08/26/18   [provider]  ACCU-CHEK GUIDE test strip USE TEST STRIPS TO CHECK BLOOD SUGAR TID 08/26/18   [provider]  amLODipine (NORVASC) 10 MG tablet Take 1 tablet (10 mg total) by mouth daily. 09/09/19   Adrian Prows, MD  aspirin 81 MG tablet Take 81 mg by mouth daily.    [provider]  cholecalciferol (VITAMIN D) 1000 UNITS tablet Take 2,000 Units by mouth daily.     [provider]  co-enzyme Q-10 50 MG capsule Take 100 mg by mouth daily.     [provider]  fish oil-omega-3 fatty acids 1000 MG capsule Take 1 g by mouth 2 (two) times a day.     [provider]  levothyroxine (SYNTHROID) 25 MCG tablet Take 25 mcg by mouth daily before breakfast.    [provider]  losartan (COZAAR) 100 MG tablet Take 100 mg by mouth daily. (2) 50mg  tablets daily    [provider]  metFORMIN (GLUCOPHAGE) 500 MG tablet Take by mouth 2 (two) times daily with a meal.    [provider]  metoprolol tartrate (LOPRESSOR) 25 MG tablet Take 25 mg by mouth 2 (two) times daily.    [provider]  mirtazapine (REMERON) 7.5 MG tablet Take 7.5 mg by mouth at bedtime.    [provider]  Multiple Vitamin (MULTIVITAMIN) capsule Take 1 capsule by mouth daily.    [provider]  niacin (NIASPAN) 1000 MG CR tablet Take 1,000 mg by mouth daily. 04/09/14   [provider]  Polyvinyl Alcohol-Povidone ([place] OP) Apply to eye.    [provider]  Probiotic Product ([employer] SENIOR PROBIOTIC) CAPS Take by mouth daily.    [provider]  simvastatin (ZOCOR) 10 MG tablet Take 10 mg by mouth at bedtime.     [provider]  thiothixene (NAVANE) 2 MG capsule Take 2 mg by mouth. TWICE A WEEK    [provider]  valACYclovir (VALTREX) 500 MG tablet TAKE 1 TABLET (500 MG TOTAL) BY MOUTH DAILY. PROPHYLAXIS 07/13/20   [name], MD  vitamin B-12 (CYANOCOBALAMIN) 1000 MCG tablet Take 1,000 mcg by mouth daily.    [provider]    Allergies    Ampicillin, Doxycycline hyclate, Lamisil [terbinafine], and Codeine  Review of Systems   Review of Systems  Constitutional:  Negative for chills, diaphoresis, fatigue and fever.  HENT:  Positive for dental problem and facial swelling. Negative for congestion, rhinorrhea and sneezing.   Eyes: Negative.   Respiratory:  Negative for cough, chest tightness and shortness of breath.   Cardiovascular:  Negative for chest  pain and leg swelling.  Gastrointestinal:  Negative for abdominal pain, blood in stool, diarrhea, nausea and vomiting.  Genitourinary:  Negative for difficulty urinating, flank pain, frequency and hematuria.  Musculoskeletal:  Negative for arthralgias and back pain.  Skin:  Negative for rash.  Neurological:  Negative for dizziness, speech  difficulty, weakness, numbness and headaches.   Physical Exam Updated Vital Signs BP (!) 151/69 (BP Location: Right Arm)   Pulse 80   Temp 98.2 F (36.8 C) (Oral)   Resp 18   SpO2 100%   Physical Exam Constitutional:      Appearance: She is well-developed.  HENT:     Head: Normocephalic and atraumatic.     Mouth/Throat:     Comments: Patient has tenderness to her right upper gum.  There is some erythema and irritation to the area.  There is also some white plaque buildup in the gum line.  No abscess noted.  No trismus.  Uvula is midline.   Eyes:     Pupils: Pupils are equal, round, and reactive to light.  Cardiovascular:     Rate and Rhythm: Normal rate and regular rhythm.     Heart sounds: Normal heart sounds.  Pulmonary:     Effort: Pulmonary effort is normal. No respiratory distress.     Breath sounds: Normal breath sounds. No wheezing or rales.  Chest:     Chest wall: No tenderness.  Abdominal:     General: Bowel sounds are normal.     Palpations: Abdomen is soft.     Tenderness: There is no abdominal tenderness. There is no guarding or rebound.  Musculoskeletal:        General: Normal range of motion.     Cervical back: Normal range of motion and neck supple.  Lymphadenopathy:     Cervical: No cervical adenopathy.  Skin:    General: Skin is warm and dry.     Findings: No rash.  Neurological:     Mental Status: She is alert and oriented to person, place, and time.     ED Results / Procedures / Treatments   Labs (all labs ordered are listed, but only abnormal results are displayed) Labs Reviewed - No data to  display  EKG None  Radiology No results found.  Procedures Procedures   Medications Ordered in ED Medications - No data to display  ED Course  I have reviewed the triage vital signs and the nursing notes.  Pertinent labs & imaging results that were available during my care of the patient were reviewed by me and considered in my medical decision making (see chart for details).    MDM Rules/Calculators/A&P                           Patient is a 80 year old female who presents with some swelling to the right side of her face.  It seems to be getting better since she started prednisone and clindamycin but today she had a little bit of bleeding from her gums.  On exam, she does seem to have periodontal disease which is likely the source of the bleeding.  There is no active bleeding.  She denies anticoagulants.  We will add Peridex mouthwash.  She will follow-up with her primary care doctor and her dentist this week.  Return precautions were given.  She was also concerned about the timing of her medications and how they interacted with food so the ED pharmacist did come talk to her and went over her medications with her. Final Clinical Impression(s) / ED Diagnoses Final diagnoses:  Dental infection    Rx / DC Orders ED Discharge Orders          Ordered    chlorhexidine (PERIDEX) 0.12 % solution  2 times daily  03/24/21 1246             Malvin Johns, MD 03/24/21 1248

## 2021-04-05 DIAGNOSIS — M818 Other osteoporosis without current pathological fracture: Secondary | ICD-10-CM | POA: Diagnosis not present

## 2021-04-05 DIAGNOSIS — E559 Vitamin D deficiency, unspecified: Secondary | ICD-10-CM | POA: Diagnosis not present

## 2021-04-05 DIAGNOSIS — G4709 Other insomnia: Secondary | ICD-10-CM | POA: Diagnosis not present

## 2021-04-05 DIAGNOSIS — E7800 Pure hypercholesterolemia, unspecified: Secondary | ICD-10-CM | POA: Diagnosis not present

## 2021-04-05 DIAGNOSIS — E038 Other specified hypothyroidism: Secondary | ICD-10-CM | POA: Diagnosis not present

## 2021-04-05 DIAGNOSIS — E119 Type 2 diabetes mellitus without complications: Secondary | ICD-10-CM | POA: Diagnosis not present

## 2021-04-05 DIAGNOSIS — I10 Essential (primary) hypertension: Secondary | ICD-10-CM | POA: Diagnosis not present

## 2021-04-22 DIAGNOSIS — M818 Other osteoporosis without current pathological fracture: Secondary | ICD-10-CM | POA: Diagnosis not present

## 2021-04-22 DIAGNOSIS — E119 Type 2 diabetes mellitus without complications: Secondary | ICD-10-CM | POA: Diagnosis not present

## 2021-04-22 DIAGNOSIS — E038 Other specified hypothyroidism: Secondary | ICD-10-CM | POA: Diagnosis not present

## 2021-04-22 DIAGNOSIS — E7800 Pure hypercholesterolemia, unspecified: Secondary | ICD-10-CM | POA: Diagnosis not present

## 2021-04-22 DIAGNOSIS — E559 Vitamin D deficiency, unspecified: Secondary | ICD-10-CM | POA: Diagnosis not present

## 2021-04-22 DIAGNOSIS — I10 Essential (primary) hypertension: Secondary | ICD-10-CM | POA: Diagnosis not present

## 2021-04-22 DIAGNOSIS — G4709 Other insomnia: Secondary | ICD-10-CM | POA: Diagnosis not present

## 2021-04-23 DIAGNOSIS — I872 Venous insufficiency (chronic) (peripheral): Secondary | ICD-10-CM | POA: Diagnosis not present

## 2021-04-23 DIAGNOSIS — I87393 Chronic venous hypertension (idiopathic) with other complications of bilateral lower extremity: Secondary | ICD-10-CM | POA: Diagnosis not present

## 2021-05-07 ENCOUNTER — Other Ambulatory Visit: Payer: Self-pay

## 2021-05-07 ENCOUNTER — Encounter: Payer: Self-pay | Admitting: Obstetrics & Gynecology

## 2021-05-07 ENCOUNTER — Ambulatory Visit (INDEPENDENT_AMBULATORY_CARE_PROVIDER_SITE_OTHER): Payer: Medicare PPO | Admitting: Obstetrics & Gynecology

## 2021-05-07 VITALS — BP 144/92 | HR 87 | Ht 60.25 in | Wt 117.0 lb

## 2021-05-07 DIAGNOSIS — Z90710 Acquired absence of both cervix and uterus: Secondary | ICD-10-CM | POA: Diagnosis not present

## 2021-05-07 DIAGNOSIS — M810 Age-related osteoporosis without current pathological fracture: Secondary | ICD-10-CM | POA: Diagnosis not present

## 2021-05-07 DIAGNOSIS — Z780 Asymptomatic menopausal state: Secondary | ICD-10-CM | POA: Diagnosis not present

## 2021-05-07 DIAGNOSIS — Z01419 Encounter for gynecological examination (general) (routine) without abnormal findings: Secondary | ICD-10-CM | POA: Diagnosis not present

## 2021-05-07 DIAGNOSIS — M8589 Other specified disorders of bone density and structure, multiple sites: Secondary | ICD-10-CM | POA: Diagnosis not present

## 2021-05-07 DIAGNOSIS — Z9189 Other specified personal risk factors, not elsewhere classified: Secondary | ICD-10-CM

## 2021-05-07 MED ORDER — ALENDRONATE SODIUM 70 MG PO TABS: 70.0000 mg | ORAL_TABLET | ORAL | 4 refills | Status: DC

## 2021-05-07 NOTE — Progress Notes (Signed)
Linda Barber April 29, 1941 [id number]   History:    80 y.o. G2P2L2    RP:  Established patient presenting for annual gyn exam    HPI: Postmenopause, well on no hormone replacement therapy.  History of total abdominal hysterectomy.  No pelvic pain.  Abstinent.  Pap Neg 04/2018.  No HSV recurrence x 2 years.  History of VIN 3.  Status post wide excision with negative margins many years ago.  Osteopenia on last BD 05/2020, significantly increased at the Spine, stable at bilateral hips on Fosamax.  Bowel movements normal. Breasts normal. Mammo 11/2020 Neg.  Body mass index 22.66.  Health labs with family physician.  [place] 2016.   Past medical history,surgical history, family history and social history were all reviewed and documented in the EPIC chart.  Gynecologic History No LMP recorded. Patient has had a hysterectomy.  Obstetric History OB History  Gravida Para Term Preterm AB Living  2 2       2   SAB IAB Ectopic Multiple Live Births               # Outcome Date GA Lbr Len/2nd Weight Sex Delivery Anes PTL Lv  2 Para           1 Para              ROS: A ROS was performed and pertinent positives and negatives are included in the history.  GENERAL: No fevers or chills. HEENT: No change in vision, no earache, sore throat or sinus congestion. NECK: No pain or stiffness. CARDIOVASCULAR: No chest pain or pressure. No palpitations. PULMONARY: No shortness of breath, cough or wheeze. GASTROINTESTINAL: No abdominal pain, nausea, vomiting or diarrhea, melena or bright red blood per rectum. GENITOURINARY: No urinary frequency, urgency, hesitancy or dysuria. MUSCULOSKELETAL: No joint or muscle pain, no back pain, no recent trauma. DERMATOLOGIC: No rash, no itching, no lesions. ENDOCRINE: No polyuria, polydipsia, no heat or cold intolerance. No recent change in weight. HEMATOLOGICAL: No anemia or easy bruising or bleeding. NEUROLOGIC: No headache, seizures, numbness, tingling or weakness.  PSYCHIATRIC: No depression, no loss of interest in normal activity or change in sleep pattern.     Exam:   BP (!) 144/92    Pulse 87    Ht 5' 0.25" (1.53 m)    Wt 117 lb (53.1 kg)    SpO2 99%    BMI 22.66 kg/m   Body mass index is 22.66 kg/m.  General appearance : Well developed well nourished female. No acute distress HEENT: Eyes: no retinal hemorrhage or exudates,  Neck supple, trachea midline, no carotid bruits, no thyroidmegaly Lungs: Clear to auscultation, no rhonchi or wheezes, or rib retractions  Heart: Regular rate and rhythm, no murmurs or gallops Breast:Examined in sitting and supine position were symmetrical in appearance, no palpable masses or tenderness,  no skin retraction, no nipple inversion, no nipple discharge, no skin discoloration, no axillary or supraclavicular lymphadenopathy Abdomen: no palpable masses or tenderness, no rebound or guarding Extremities: no edema or skin discoloration or tenderness  Pelvic: Vulva: Normal             Vagina: No gross lesions or discharge  Cervix/Uterus absent  Adnexa  Without masses or tenderness  Anus: Normal   Assessment/Plan:  80 y.o. female for annual exam   1. Well female exam with routine gynecological exam Postmenopause, well on no hormone replacement therapy.  History of total abdominal hysterectomy.  No pelvic pain.  Abstinent.  Pap Neg 04/2018.  No HSV recurrence x 2 years.  History of VIN 3.  Status post wide excision with negative margins many years ago.  Osteopenia on last BD 05/2020, significantly increased at the Spine, stable at bilateral hips on Fosamax.  Bowel movements normal. Breasts normal. Mammo 11/2020 Neg.  Body mass index 22.66.  Health labs with family physician.  [place] 2016.  2. At risk of fracture due to osteopenia  3. S/P TAH (total abdominal hysterectomy)  4. Postmenopausal Postmenopause, well on no hormone replacement therapy.  History of total abdominal hysterectomy.  No pelvic pain.  Abstinent.    5. Osteopenia of multiple sites  Osteopenia on last BD 05/2020, significantly increased at the Spine, stable at bilateral hips on Fosamax.  Continue with Fosamaz, Vit D supplements, Ca++ 1.2-1.5 g/d total, regular wt bearing physical activities.  Other orders - alendronate (FOSAMAX) 70 MG tablet; 1 tab(s) - Coenzyme Q10 (CO Q-10) 100 MG CAPS; 1 cap(s) - vitamin B-12 (CYANOCOBALAMIN) 1000 MCG tablet; 1 tab(s) - estradiol (ESTRACE) 0.1 MG/GM vaginal cream; Place vaginally. - levothyroxine (SYNTHROID) 25 MCG tablet; 1 tab(s) - losartan (COZAAR) 100 MG tablet; 1 tab(s) - metoprolol succinate (TOPROL-XL) 25 MG 24 hr tablet; 1 tab(s) - valACYclovir (VALTREX) 1000 MG tablet; 1 tab(s)   [name] MD, 1:44 PM 05/07/2021

## 2021-05-08 ENCOUNTER — Telehealth: Payer: Self-pay | Admitting: *Deleted

## 2021-05-08 NOTE — Telephone Encounter (Signed)
Patient called was seen for annual exam on 05/07/21 states she received a text from [facility] about fosamax 70 mg tablet ready for pickup. Patient reports she has not been on this medication since 2021 and was not aware a Rx was going to be sent to pharmacy yesterday and that she needed to restart medication. She mentioned she also started on synthroid 25 mcg tablet last fall which she must take on empty stomach if she has to start back on fosamax is okay to take both at same time on empty stomach? Please advise

## 2021-05-09 NOTE — Telephone Encounter (Signed)
Patient informed, she is not going to start medication.

## 2021-07-29 ENCOUNTER — Encounter: Payer: Self-pay | Admitting: Obstetrics & Gynecology

## 2021-07-29 ENCOUNTER — Ambulatory Visit: Payer: Medicare PPO | Admitting: Obstetrics & Gynecology

## 2021-07-29 ENCOUNTER — Other Ambulatory Visit: Payer: Self-pay

## 2021-07-29 VITALS — BP 138/82 | Wt 120.0 lb

## 2021-07-29 DIAGNOSIS — M8589 Other specified disorders of bone density and structure, multiple sites: Secondary | ICD-10-CM | POA: Diagnosis not present

## 2021-07-29 DIAGNOSIS — Z780 Asymptomatic menopausal state: Secondary | ICD-10-CM

## 2021-07-29 MED ORDER — ALENDRONATE SODIUM 70 MG PO TABS: 70.0000 mg | ORAL_TABLET | ORAL | 4 refills | Status: DC

## 2021-07-29 NOTE — Progress Notes (Signed)
? ? ?  Linda Barber 1940/09/04 [id number] ? ? ?     81 y.o.  G2P2L2 ? ?RP: Counseling on treatment and management of Osteoporosis/Osteopenia with Fosamax ? ?HPI: H/O Osteoporosis, improved to Osteopenia on Fosamax x 5 yrs.  Fosamax holiday x almost 2 years from 09/2019 until now.  Ca++, Vit D, walking.  No recent fall.  No current fracture. ? ? ?OB History  ?Gravida Para Term Preterm AB Living  ?'2 2       2  '$ ?SAB IAB Ectopic Multiple Live Births  ?           ?  ?# Outcome Date GA Lbr Len/2nd Weight Sex Delivery Anes PTL Lv  ?2 Para           ?1 Para           ? ? ?Past medical history,surgical history, problem list, medications, allergies, family history and social history were all reviewed and documented in the EPIC chart. ? ? ?Directed ROS with pertinent positives and negatives documented in the history of present illness/assessment and plan. ? ?Exam: ? ?Vitals:  ? 07/29/21 1353  ?BP: 138/82  ?Weight: 120 lb (54.4 kg)  ? ?General appearance:  Normal ? ?Bone Density 05/29/2020:  Osteopenia at the Left total Hip with a T-Score at -1.7.  All sites showing Osteopenia.  Significant improvement at the AP Spine, other sites stable on Fosamax x 5 years, stopped in 09/2019. ? ? ?Assessment/Plan:  80 y.o. G2P2  ? ?1. Osteopenia of multiple sites ?H/O Osteoporosis, improved to Osteopenia on Fosamax x 5 yrs.  Fosamax holiday x almost 2 years from 09/2019 until now.  Recommend restarting on Fosamax 70 mg PO weekly.  Usage explained in details.  Risks/benefits reviewed.  Prescription of Fosamax resent to pharmacy.  Will repeat a BD at 2 years in 05/2022.  Ca++ 1.2-1.5 g/d total, Vit D supplement and regular weight bearing physical activity. ?- DG Bone Density; Future ? ?2. Postmenopausal ?Well on no HRT.  No PMB. ?- DG Bone Density; Future ? ?Other orders ?- alendronate (FOSAMAX) 70 MG tablet; Take 1 tablet (70 mg total) by mouth once a week. Take with a full glass of water on an empty stomach.  ? ?[name] MD, 2:20 PM  07/29/2021 ? ? ? ?  ?

## 2021-08-13 DIAGNOSIS — M818 Other osteoporosis without current pathological fracture: Secondary | ICD-10-CM | POA: Diagnosis not present

## 2021-08-13 DIAGNOSIS — G4709 Other insomnia: Secondary | ICD-10-CM | POA: Diagnosis not present

## 2021-08-13 DIAGNOSIS — E038 Other specified hypothyroidism: Secondary | ICD-10-CM | POA: Diagnosis not present

## 2021-08-13 DIAGNOSIS — E119 Type 2 diabetes mellitus without complications: Secondary | ICD-10-CM | POA: Diagnosis not present

## 2021-08-13 DIAGNOSIS — I10 Essential (primary) hypertension: Secondary | ICD-10-CM | POA: Diagnosis not present

## 2021-08-13 DIAGNOSIS — E7800 Pure hypercholesterolemia, unspecified: Secondary | ICD-10-CM | POA: Diagnosis not present

## 2021-08-13 DIAGNOSIS — E559 Vitamin D deficiency, unspecified: Secondary | ICD-10-CM | POA: Diagnosis not present

## 2021-08-19 ENCOUNTER — Other Ambulatory Visit: Payer: Self-pay | Admitting: Obstetrics & Gynecology

## 2021-08-19 NOTE — Telephone Encounter (Signed)
AEX 05/07/21. ?

## 2021-08-27 ENCOUNTER — Other Ambulatory Visit: Payer: Medicare PPO

## 2021-08-27 ENCOUNTER — Ambulatory Visit: Payer: Medicare PPO | Admitting: Obstetrics and Gynecology

## 2021-08-27 ENCOUNTER — Other Ambulatory Visit: Payer: Medicare PPO | Admitting: Obstetrics and Gynecology

## 2021-09-30 DIAGNOSIS — E119 Type 2 diabetes mellitus without complications: Secondary | ICD-10-CM | POA: Diagnosis not present

## 2021-09-30 DIAGNOSIS — G4709 Other insomnia: Secondary | ICD-10-CM | POA: Diagnosis not present

## 2021-09-30 DIAGNOSIS — M818 Other osteoporosis without current pathological fracture: Secondary | ICD-10-CM | POA: Diagnosis not present

## 2021-09-30 DIAGNOSIS — E559 Vitamin D deficiency, unspecified: Secondary | ICD-10-CM | POA: Diagnosis not present

## 2021-09-30 DIAGNOSIS — E038 Other specified hypothyroidism: Secondary | ICD-10-CM | POA: Diagnosis not present

## 2021-09-30 DIAGNOSIS — M79672 Pain in left foot: Secondary | ICD-10-CM | POA: Diagnosis not present

## 2021-09-30 DIAGNOSIS — E7800 Pure hypercholesterolemia, unspecified: Secondary | ICD-10-CM | POA: Diagnosis not present

## 2021-09-30 DIAGNOSIS — I10 Essential (primary) hypertension: Secondary | ICD-10-CM | POA: Diagnosis not present

## 2021-10-07 ENCOUNTER — Other Ambulatory Visit (HOSPITAL_COMMUNITY): Payer: Self-pay | Admitting: Physician Assistant

## 2021-10-07 ENCOUNTER — Ambulatory Visit (HOSPITAL_COMMUNITY)
Admission: RE | Admit: 2021-10-07 | Discharge: 2021-10-07 | Disposition: A | Discharge status: Home / Self Care | Payer: Medicare PPO | Source: Ambulatory Visit | Attending: Physician Assistant | Admitting: Physician Assistant

## 2021-10-07 DIAGNOSIS — M79673 Pain in unspecified foot: Secondary | ICD-10-CM | POA: Diagnosis not present

## 2021-11-20 DIAGNOSIS — E119 Type 2 diabetes mellitus without complications: Secondary | ICD-10-CM | POA: Diagnosis not present

## 2021-11-20 DIAGNOSIS — E038 Other specified hypothyroidism: Secondary | ICD-10-CM | POA: Diagnosis not present

## 2021-11-20 DIAGNOSIS — M818 Other osteoporosis without current pathological fracture: Secondary | ICD-10-CM | POA: Diagnosis not present

## 2021-11-20 DIAGNOSIS — E7800 Pure hypercholesterolemia, unspecified: Secondary | ICD-10-CM | POA: Diagnosis not present

## 2021-11-20 DIAGNOSIS — I10 Essential (primary) hypertension: Secondary | ICD-10-CM | POA: Diagnosis not present

## 2021-11-20 DIAGNOSIS — G4709 Other insomnia: Secondary | ICD-10-CM | POA: Diagnosis not present

## 2021-11-20 DIAGNOSIS — E559 Vitamin D deficiency, unspecified: Secondary | ICD-10-CM | POA: Diagnosis not present

## 2021-11-20 DIAGNOSIS — Z0001 Encounter for general adult medical examination with abnormal findings: Secondary | ICD-10-CM | POA: Diagnosis not present

## 2021-12-12 DIAGNOSIS — H903 Sensorineural hearing loss, bilateral: Secondary | ICD-10-CM | POA: Diagnosis not present

## 2021-12-12 DIAGNOSIS — H6123 Impacted cerumen, bilateral: Secondary | ICD-10-CM | POA: Diagnosis not present

## 2021-12-16 DIAGNOSIS — E559 Vitamin D deficiency, unspecified: Secondary | ICD-10-CM | POA: Diagnosis not present

## 2021-12-16 DIAGNOSIS — E038 Other specified hypothyroidism: Secondary | ICD-10-CM | POA: Diagnosis not present

## 2021-12-16 DIAGNOSIS — M818 Other osteoporosis without current pathological fracture: Secondary | ICD-10-CM | POA: Diagnosis not present

## 2021-12-16 DIAGNOSIS — I10 Essential (primary) hypertension: Secondary | ICD-10-CM | POA: Diagnosis not present

## 2021-12-16 DIAGNOSIS — G4709 Other insomnia: Secondary | ICD-10-CM | POA: Diagnosis not present

## 2021-12-16 DIAGNOSIS — E119 Type 2 diabetes mellitus without complications: Secondary | ICD-10-CM | POA: Diagnosis not present

## 2021-12-16 DIAGNOSIS — E7800 Pure hypercholesterolemia, unspecified: Secondary | ICD-10-CM | POA: Diagnosis not present

## 2021-12-27 DIAGNOSIS — Z1231 Encounter for screening mammogram for malignant neoplasm of breast: Secondary | ICD-10-CM | POA: Diagnosis not present

## 2021-12-30 DIAGNOSIS — G4709 Other insomnia: Secondary | ICD-10-CM | POA: Diagnosis not present

## 2021-12-30 DIAGNOSIS — I10 Essential (primary) hypertension: Secondary | ICD-10-CM | POA: Diagnosis not present

## 2021-12-30 DIAGNOSIS — E7800 Pure hypercholesterolemia, unspecified: Secondary | ICD-10-CM | POA: Diagnosis not present

## 2021-12-30 DIAGNOSIS — E559 Vitamin D deficiency, unspecified: Secondary | ICD-10-CM | POA: Diagnosis not present

## 2021-12-30 DIAGNOSIS — E119 Type 2 diabetes mellitus without complications: Secondary | ICD-10-CM | POA: Diagnosis not present

## 2021-12-30 DIAGNOSIS — M818 Other osteoporosis without current pathological fracture: Secondary | ICD-10-CM | POA: Diagnosis not present

## 2021-12-30 DIAGNOSIS — E038 Other specified hypothyroidism: Secondary | ICD-10-CM | POA: Diagnosis not present

## 2022-01-01 ENCOUNTER — Encounter: Payer: Self-pay | Admitting: Obstetrics & Gynecology

## 2022-01-13 DIAGNOSIS — M818 Other osteoporosis without current pathological fracture: Secondary | ICD-10-CM | POA: Diagnosis not present

## 2022-01-13 DIAGNOSIS — E559 Vitamin D deficiency, unspecified: Secondary | ICD-10-CM | POA: Diagnosis not present

## 2022-01-13 DIAGNOSIS — E119 Type 2 diabetes mellitus without complications: Secondary | ICD-10-CM | POA: Diagnosis not present

## 2022-01-13 DIAGNOSIS — Z0000 Encounter for general adult medical examination without abnormal findings: Secondary | ICD-10-CM | POA: Diagnosis not present

## 2022-01-13 DIAGNOSIS — I10 Essential (primary) hypertension: Secondary | ICD-10-CM | POA: Diagnosis not present

## 2022-01-13 DIAGNOSIS — G4709 Other insomnia: Secondary | ICD-10-CM | POA: Diagnosis not present

## 2022-01-13 DIAGNOSIS — E7800 Pure hypercholesterolemia, unspecified: Secondary | ICD-10-CM | POA: Diagnosis not present

## 2022-01-13 DIAGNOSIS — E038 Other specified hypothyroidism: Secondary | ICD-10-CM | POA: Diagnosis not present

## 2022-03-03 DIAGNOSIS — G4709 Other insomnia: Secondary | ICD-10-CM | POA: Diagnosis not present

## 2022-03-03 DIAGNOSIS — E119 Type 2 diabetes mellitus without complications: Secondary | ICD-10-CM | POA: Diagnosis not present

## 2022-03-03 DIAGNOSIS — E559 Vitamin D deficiency, unspecified: Secondary | ICD-10-CM | POA: Diagnosis not present

## 2022-03-03 DIAGNOSIS — E038 Other specified hypothyroidism: Secondary | ICD-10-CM | POA: Diagnosis not present

## 2022-03-03 DIAGNOSIS — M818 Other osteoporosis without current pathological fracture: Secondary | ICD-10-CM | POA: Diagnosis not present

## 2022-03-03 DIAGNOSIS — E7800 Pure hypercholesterolemia, unspecified: Secondary | ICD-10-CM | POA: Diagnosis not present

## 2022-03-03 DIAGNOSIS — I10 Essential (primary) hypertension: Secondary | ICD-10-CM | POA: Diagnosis not present

## 2022-03-14 DIAGNOSIS — G4709 Other insomnia: Secondary | ICD-10-CM | POA: Diagnosis not present

## 2022-03-14 DIAGNOSIS — E7800 Pure hypercholesterolemia, unspecified: Secondary | ICD-10-CM | POA: Diagnosis not present

## 2022-03-14 DIAGNOSIS — M818 Other osteoporosis without current pathological fracture: Secondary | ICD-10-CM | POA: Diagnosis not present

## 2022-03-14 DIAGNOSIS — I10 Essential (primary) hypertension: Secondary | ICD-10-CM | POA: Diagnosis not present

## 2022-03-14 DIAGNOSIS — E119 Type 2 diabetes mellitus without complications: Secondary | ICD-10-CM | POA: Diagnosis not present

## 2022-03-14 DIAGNOSIS — E038 Other specified hypothyroidism: Secondary | ICD-10-CM | POA: Diagnosis not present

## 2022-03-14 DIAGNOSIS — J0100 Acute maxillary sinusitis, unspecified: Secondary | ICD-10-CM | POA: Diagnosis not present

## 2022-03-14 DIAGNOSIS — E559 Vitamin D deficiency, unspecified: Secondary | ICD-10-CM | POA: Diagnosis not present

## 2022-03-24 DIAGNOSIS — H524 Presbyopia: Secondary | ICD-10-CM | POA: Diagnosis not present

## 2022-03-24 DIAGNOSIS — Z961 Presence of intraocular lens: Secondary | ICD-10-CM | POA: Diagnosis not present

## 2022-03-24 DIAGNOSIS — D3101 Benign neoplasm of right conjunctiva: Secondary | ICD-10-CM | POA: Diagnosis not present

## 2022-03-24 DIAGNOSIS — E119 Type 2 diabetes mellitus without complications: Secondary | ICD-10-CM | POA: Diagnosis not present

## 2022-04-14 DIAGNOSIS — E7800 Pure hypercholesterolemia, unspecified: Secondary | ICD-10-CM | POA: Diagnosis not present

## 2022-04-14 DIAGNOSIS — E559 Vitamin D deficiency, unspecified: Secondary | ICD-10-CM | POA: Diagnosis not present

## 2022-04-14 DIAGNOSIS — E119 Type 2 diabetes mellitus without complications: Secondary | ICD-10-CM | POA: Diagnosis not present

## 2022-04-14 DIAGNOSIS — M818 Other osteoporosis without current pathological fracture: Secondary | ICD-10-CM | POA: Diagnosis not present

## 2022-04-14 DIAGNOSIS — E038 Other specified hypothyroidism: Secondary | ICD-10-CM | POA: Diagnosis not present

## 2022-04-14 DIAGNOSIS — I10 Essential (primary) hypertension: Secondary | ICD-10-CM | POA: Diagnosis not present

## 2022-04-14 DIAGNOSIS — G4709 Other insomnia: Secondary | ICD-10-CM | POA: Diagnosis not present

## 2022-05-05 DIAGNOSIS — I872 Venous insufficiency (chronic) (peripheral): Secondary | ICD-10-CM | POA: Diagnosis not present

## 2022-05-29 DIAGNOSIS — E119 Type 2 diabetes mellitus without complications: Secondary | ICD-10-CM | POA: Diagnosis not present

## 2022-05-29 DIAGNOSIS — E7800 Pure hypercholesterolemia, unspecified: Secondary | ICD-10-CM | POA: Diagnosis not present

## 2022-05-29 DIAGNOSIS — M818 Other osteoporosis without current pathological fracture: Secondary | ICD-10-CM | POA: Diagnosis not present

## 2022-05-29 DIAGNOSIS — N3000 Acute cystitis without hematuria: Secondary | ICD-10-CM | POA: Diagnosis not present

## 2022-05-29 DIAGNOSIS — E559 Vitamin D deficiency, unspecified: Secondary | ICD-10-CM | POA: Diagnosis not present

## 2022-05-29 DIAGNOSIS — I10 Essential (primary) hypertension: Secondary | ICD-10-CM | POA: Diagnosis not present

## 2022-05-29 DIAGNOSIS — E038 Other specified hypothyroidism: Secondary | ICD-10-CM | POA: Diagnosis not present

## 2022-05-29 DIAGNOSIS — G4709 Other insomnia: Secondary | ICD-10-CM | POA: Diagnosis not present

## 2022-06-04 DIAGNOSIS — I10 Essential (primary) hypertension: Secondary | ICD-10-CM | POA: Diagnosis not present

## 2022-06-04 DIAGNOSIS — N3000 Acute cystitis without hematuria: Secondary | ICD-10-CM | POA: Diagnosis not present

## 2022-06-10 ENCOUNTER — Ambulatory Visit: Payer: Medicare HMO

## 2022-06-10 DIAGNOSIS — M8589 Other specified disorders of bone density and structure, multiple sites: Secondary | ICD-10-CM

## 2022-06-10 DIAGNOSIS — Z13820 Encounter for screening for osteoporosis: Secondary | ICD-10-CM | POA: Diagnosis not present

## 2022-06-10 DIAGNOSIS — Z780 Asymptomatic menopausal state: Secondary | ICD-10-CM | POA: Diagnosis not present

## 2022-06-14 ENCOUNTER — Other Ambulatory Visit: Payer: Self-pay

## 2022-06-14 ENCOUNTER — Ambulatory Visit
Admission: EM | Admit: 2022-06-14 | Discharge: 2022-06-14 | Disposition: A | Discharge status: Home / Self Care | Payer: Medicare HMO | Attending: Internal Medicine | Admitting: Internal Medicine

## 2022-06-14 DIAGNOSIS — R143 Flatulence: Secondary | ICD-10-CM | POA: Diagnosis not present

## 2022-06-14 DIAGNOSIS — R109 Unspecified abdominal pain: Secondary | ICD-10-CM

## 2022-06-14 NOTE — ED Triage Notes (Signed)
Pt reports having constipation X a few weeks.  States she take Galalax occasionally. Reports her PCP told her to take a table spoon of extra olive oil on an empty stomach  in the mornings to help with constipation. States she recently had a UTI and was put on an antibiotic, and c/o starting to have bowel movements 2-3 times a day. States she started to also have a lot of gas after.

## 2022-06-14 NOTE — ED Provider Notes (Signed)
[facility]    CSN: [id number] Arrival date & time: 06/14/22  0810      History   Chief Complaint Chief Complaint  Patient presents with   Constipation    HPI Linda Barber is a 82 y.o. female.   Patient presents today with concerns for increased flatulence.  She states that she has been increasingly flatulent for the past 3 to 4 days.  She is attributing it to taking a recent antibiotic.  She states that she was prescribed ciprofloxacin antibiotic for UTI that she took for approximately 6 days and completed approximately 1 to 1.5 weeks ago.  Although, she reports that the flatulence started about 3 to 4 days ago.  She states that she has been dealing with constipation for about 6 months intermittently.  She was told by primary care doctor to take Gavilax and olive oil daily.  This helped improve constipation and she has been having bowel movements daily.  States that she has approximately 2-3 bowel movements a day which has been consistent ever since increased flatulence started.  She states that the bowel movements are formed and she denies any diarrhea.  Denies nausea, vomiting, blood in stool.  She reports that she does have some very "mild abdominal discomfort" in the left lower quadrant but it is not super bothersome and it is intermittent.  She denies any associated fever.  She is able to tolerate food and fluids.  She states that she started taking Beano when the flatulence started that has provided a lot of improvement and she stopped eating beans.  She denies any increase in urinary symptoms.  She reports that she has dysuria at baseline that has been present for 4 years but has not changed since increased flatulence or abdominal discomfort started. She states that she has also lost about 4 pounds over the past 4 weeks.    Constipation   Past Medical History:  Diagnosis Date   Cancer ([place]) 2008   VULVAR CARCINOMA INSITU   Costochondritis    Diabetes mellitus  without complication ([place])    pt. reports borderline    Diverticulosis    High cholesterol    Hypertension    Osteopenia    Osteoporosis    Reynolds syndrome [facility])    Shingles     Patient Active Problem List   Diagnosis Date Noted   Raynaud's phenomenon 09/10/2018   Elevated Lp(a) 09/10/2018   Essential hypertension 07/01/2018   Hyperlipemia 07/01/2018   Tachycardia 07/01/2018   Osteoporosis 06/19/2015   VIN III (vulvar intraepithelial neoplasia III) 04/05/2013   Vaginal atrophy 04/05/2013    Past Surgical History:  Procedure Laterality Date   ABDOMINAL HYSTERECTOMY     CATARACT EXTRACTION     CHOLECYSTECTOMY N/A 05/03/2014   Procedure: LAPAROSCOPIC CHOLECYSTECTOMY WITH INTRAOPERATIVE CHOLANGIOGRAM;  Surgeon: Autumn Messing III, MD;  Location: [facility];  Service: General;  Laterality: N/A;   COLONOSCOPY  December 16 2012   [place]  2014   HEMORROIDECTOMY      OB History     Gravida  2   Para  2   Term      Preterm      AB      Living  2      SAB      IAB      Ectopic      Multiple      Live Births  Home Medications    Prior to Admission medications   Medication Sig Start Date End Date Taking? Authorizing Provider  Accu-Chek FastClix Lancets MISC USE LANCETS TO CHECK BLOOD SUGAR TID 08/26/18   [provider]  ACCU-CHEK GUIDE test strip USE TEST STRIPS TO CHECK BLOOD SUGAR TID 08/26/18   [provider]  alendronate (FOSAMAX) 70 MG tablet Take 1 tablet (70 mg total) by mouth once a week. Take with a full glass of water on an empty stomach. 07/29/21   [name], MD  aspirin 81 MG tablet Take 81 mg by mouth daily.    [provider]  cholecalciferol (VITAMIN D) 1000 UNITS tablet Take 2,000 Units by mouth daily.     [provider]  Coenzyme Q10 (CO Q-10) 100 MG CAPS 1 cap(s)    [provider]  estradiol (ESTRACE) 0.1 MG/GM vaginal cream Place vaginally. 01/07/21   [provider]  fish oil-omega-3 fatty acids 1000 MG capsule Take 1 g by mouth 2 (two) times a day.     [provider]  levothyroxine (SYNTHROID) 25 MCG tablet 1 tab(s)    [provider]  losartan (COZAAR) 100 MG tablet 1 tab(s)    [provider]  metFORMIN (GLUCOPHAGE) 500 MG tablet Take by mouth 2 (two) times daily with a meal.    [provider]  metoprolol succinate (TOPROL-XL) 25 MG 24 hr tablet 1 tab(s) 03/15/20   [provider]  mirtazapine (REMERON) 7.5 MG tablet Take 7.5 mg by mouth at bedtime.    [provider]  Multiple Vitamin (MULTIVITAMIN) capsule Take 1 capsule by mouth daily.    [provider]  niacin (NIASPAN) 1000 MG CR tablet Take 1,000 mg by mouth daily. 04/09/14   [provider]  Polyvinyl Alcohol-Povidone (Chical OP) Apply to eye.    [provider]  Probiotic Product ([facility] SENIOR PROBIOTIC) CAPS Take by mouth daily.    [provider]  simvastatin (ZOCOR) 10 MG tablet Take 10 mg by mouth at bedtime.     [provider]  thiothixene (NAVANE) 2 MG capsule Take 2 mg by mouth. TWICE A WEEK    [provider]  valACYclovir (VALTREX) 1000 MG tablet 1 tab(s)    [provider]  valACYclovir (VALTREX) 500 MG tablet TAKE 1 TABLET BY MOUTH DAILY (PROPHYLACTIC) 08/20/21   [name], MD  vitamin B-12 (CYANOCOBALAMIN) 1000 MCG tablet 1 tab(s) 12/13/18   [provider]    Family History Family History  Problem Relation Age of Onset   Hypertension Mother    Hypertension Sister    Hypertension Brother    Heart attack Brother    Diabetes Cousin     Social History Social History   Tobacco Use   Smoking status: Never   Smokeless tobacco: Never  Vaping Use   Vaping Use: Never used  Substance Use Topics   Alcohol use: No    Alcohol/week: 0.0 standard drinks of alcohol   Drug use: No     Allergies   Ampicillin, Doxycycline hyclate,  Lamisil [terbinafine], and Codeine   Review of Systems Review of Systems Per HPI  Physical Exam Triage Vital Signs ED Triage Vitals  Enc Vitals Group     BP 06/14/22 0831 (!) 141/84     Pulse Rate 06/14/22 0831 (!) 101     Resp 06/14/22 0831 18     Temp 06/14/22 0832 97.8 F (36.6 C)     Temp Source  06/14/22 0831 Oral     SpO2 06/14/22 0831 97 %     Weight --      Height --      Head Circumference --      Peak Flow --      Pain Score 06/14/22 0831 0     Pain Loc --      Pain Edu? --      Excl. in [place]? --    No data found.  Updated Vital Signs BP (!) 141/84 (BP Location: Left Arm)   Pulse (!) 101   Temp 97.8 F (36.6 C) (Oral)   Resp 18   SpO2 97%   Visual Acuity Right Eye Distance:   Left Eye Distance:   Bilateral Distance:    Right Eye Near:   Left Eye Near:    Bilateral Near:     Physical Exam Constitutional:      General: She is not in acute distress.    Appearance: Normal appearance. She is not toxic-appearing or diaphoretic.  HENT:     Head: Normocephalic and atraumatic.  Eyes:     Extraocular Movements: Extraocular movements intact.     Conjunctiva/sclera: Conjunctivae normal.  Cardiovascular:     Rate and Rhythm: Normal rate and regular rhythm.     Pulses: Normal pulses.     Heart sounds: Normal heart sounds.  Pulmonary:     Effort: Pulmonary effort is normal. No respiratory distress.     Breath sounds: Normal breath sounds.  Abdominal:     General: Bowel sounds are normal. There is no distension.     Palpations: Abdomen is soft.     Tenderness: There is no abdominal tenderness.  Neurological:     General: No focal deficit present.     Mental Status: She is alert and oriented to person, place, and time. Mental status is at baseline.  Psychiatric:        Mood and Affect: Mood normal.        Behavior: Behavior normal.        Thought Content: Thought content normal.        Judgment: Judgment normal.      UC Treatments / Results   Labs (all labs ordered are listed, but only abnormal results are displayed) Labs Reviewed  CBC  COMPREHENSIVE METABOLIC PANEL    EKG   Radiology No results found.  Procedures Procedures (including critical care time)  Medications Ordered in UC Medications - No data to display  Initial Impression / Assessment and Plan / UC Course  I have reviewed the triage vital signs and the nursing notes.  Pertinent labs & imaging results that were available during my care of the patient were reviewed by me and considered in my medical decision making (see chart for details).     Patient reporting increased flatulence over the past 3 to 4 days.  There are no signs of acute abdomen or dehydration on exam, vital signs are normal, and patient appears physically stable on physical exam.  Therefore, do not think that emergent evaluation is necessary.  Patient was advised to avoid gas producing foods and she was educated on this.  Advised adequate fluid intake as well.  Patient had recent antibiotic therapy that was completed approximately 1 week ago but I have a low suspicion for C. difficile given that she is not having any diarrhea and would not be able to test for this given stool is solid.  Will obtain CMP and CBC to  rule out any worrisome etiology.  Patient was advised to follow-up with PCP and provided contact information for gastroenterology for further evaluation and management of the symptoms.  She was given strict return and ER precautions.  Patient verbalized understanding and was agreeable with plan. Final Clinical Impressions(s) / UC Diagnoses   Final diagnoses:  Flatulence  Abdominal discomfort     Discharge Instructions      Your blood work is pending.  We will call if there are any abnormalities.  Please follow-up with your primary care doctor and GI doctor at provided contact information for further evaluation and management.  Go to the ER if symptoms persist or worsen.  Please  avoid gas producing foods which include beans, cabbage, onions, broccoli, Brussels sprouts, wheat, potatoes.    ED Prescriptions   None    PDMP not reviewed this encounter.   Teodora Medici,  06/14/22 1013

## 2022-06-14 NOTE — Discharge Instructions (Addendum)
Your blood work is pending.  We will call if there are any abnormalities.  Please follow-up with your primary care doctor and GI doctor at provided contact information for further evaluation and management.  Go to the ER if symptoms persist or worsen.  Please avoid gas producing foods which include beans, cabbage, onions, broccoli, Brussels sprouts, wheat, potatoes.

## 2022-06-15 LAB — COMPREHENSIVE METABOLIC PANEL
ALT: 18 IU/L (ref 0–32)
AST: 27 IU/L (ref 0–40)
Albumin/Globulin Ratio: 1.4 (ref 1.2–2.2)
Albumin: 4.5 g/dL (ref 3.8–4.8)
Alkaline Phosphatase: 41 IU/L — ABNORMAL LOW (ref 44–121)
BUN/Creatinine Ratio: 14 (ref 12–28)
BUN: 10 mg/dL (ref 8–27)
Bilirubin Total: 0.2 mg/dL (ref 0.0–1.2)
CO2: 20 mmol/L (ref 20–29)
Calcium: 10.4 mg/dL — ABNORMAL HIGH (ref 8.7–10.3)
Chloride: 99 mmol/L (ref 96–106)
Creatinine, Ser: 0.74 mg/dL (ref 0.57–1.00)
Globulin, Total: 3.2 g/dL (ref 1.5–4.5)
Glucose: 101 mg/dL — ABNORMAL HIGH (ref 70–99)
Potassium: 4.5 mmol/L (ref 3.5–5.2)
Sodium: 136 mmol/L (ref 134–144)
Total Protein: 7.7 g/dL (ref 6.0–8.5)
eGFR: 82 mL/min/{1.73_m2} (ref >59)

## 2022-06-15 LAB — CBC
Hematocrit: 40.5 % (ref 34.0–46.6)
Hemoglobin: 13 g/dL (ref 11.1–15.9)
MCH: 27.3 pg (ref 26.6–33.0)
MCHC: 32.1 g/dL (ref 31.5–35.7)
MCV: 85 fL (ref 79–97)
Platelets: 299 10*3/uL (ref 150–450)
RBC: 4.77 x10E6/uL (ref 3.77–5.28)
RDW: 12.8 % (ref 11.7–15.4)
WBC: 4.5 10*3/uL (ref 3.4–10.8)

## 2022-06-16 DIAGNOSIS — G4709 Other insomnia: Secondary | ICD-10-CM | POA: Diagnosis not present

## 2022-06-16 DIAGNOSIS — M818 Other osteoporosis without current pathological fracture: Secondary | ICD-10-CM | POA: Diagnosis not present

## 2022-06-16 DIAGNOSIS — K5909 Other constipation: Secondary | ICD-10-CM | POA: Diagnosis not present

## 2022-06-16 DIAGNOSIS — E038 Other specified hypothyroidism: Secondary | ICD-10-CM | POA: Diagnosis not present

## 2022-06-16 DIAGNOSIS — E7800 Pure hypercholesterolemia, unspecified: Secondary | ICD-10-CM | POA: Diagnosis not present

## 2022-06-16 DIAGNOSIS — I10 Essential (primary) hypertension: Secondary | ICD-10-CM | POA: Diagnosis not present

## 2022-06-16 DIAGNOSIS — E559 Vitamin D deficiency, unspecified: Secondary | ICD-10-CM | POA: Diagnosis not present

## 2022-06-16 DIAGNOSIS — E119 Type 2 diabetes mellitus without complications: Secondary | ICD-10-CM | POA: Diagnosis not present

## 2022-06-17 ENCOUNTER — Encounter: Payer: Self-pay | Admitting: Gastroenterology

## 2022-07-01 ENCOUNTER — Other Ambulatory Visit: Payer: Self-pay

## 2022-07-01 DIAGNOSIS — B009 Herpesviral infection, unspecified: Secondary | ICD-10-CM

## 2022-07-01 MED ORDER — VALACYCLOVIR HCL 500 MG PO TABS: ORAL_TABLET | ORAL | 1 refills | Status: DC

## 2022-07-01 NOTE — Telephone Encounter (Signed)
Pt calling to report needing refills on [facility] versus [facility] due to insurance change.   Last AEX 05/07/2021--currently scheduled for 05/11/2023. However, I spoke with pt and made her aware that she is considered HR and medicare will cover her B&P exams qyr and it is actually recommended that we see her on a yearly basis since we prescribe her regular/maintenance medications. Pt voiced understanding. Will send msg to appt desk and have them move up pt's appt.

## 2022-07-01 NOTE — Telephone Encounter (Signed)
Melton Alar, CMA Pt rescheduled for June her choice.  B&P scheduled for 10/23/2022.   Rx pend.

## 2022-07-10 ENCOUNTER — Ambulatory Visit (INDEPENDENT_AMBULATORY_CARE_PROVIDER_SITE_OTHER): Payer: Medicare HMO | Admitting: Gastroenterology

## 2022-07-10 ENCOUNTER — Encounter: Payer: Self-pay | Admitting: Gastroenterology

## 2022-07-10 VITALS — BP 114/74 | HR 93 | Ht 60.0 in | Wt 118.0 lb

## 2022-07-10 DIAGNOSIS — K5900 Constipation, unspecified: Secondary | ICD-10-CM | POA: Diagnosis not present

## 2022-07-10 DIAGNOSIS — R143 Flatulence: Secondary | ICD-10-CM | POA: Diagnosis not present

## 2022-07-10 NOTE — Patient Instructions (Addendum)
_______________________________________________________  If your blood pressure at your visit was 140/90 or greater, please contact your primary care physician to follow up on this.  _______________________________________________________  If you are age 81 or older, your body mass index should be between 23-30. Your Body mass index is 23.05 kg/m. If this is out of the aforementioned range listed, please consider follow up with your Primary Care Provider.  If you are age 42 or younger, your body mass index should be between 19-25. Your Body mass index is 23.05 kg/m. If this is out of the aformentioned range listed, please consider follow up with your Primary Care Provider.   ________________________________________________________  The [facility] providers would like to encourage you to use [facility] to communicate with providers for non-urgent requests or questions.  Due to long hold times on the telephone, sending your provider a message by [facility] may be a faster and more efficient way to get a response.  Please allow 48 business hours for a response.  Please remember that this is for non-urgent requests.  _______________________________________________________  Samples of this drug were given to the patient:  IB Guard , 4 boxes  Follow up as needed

## 2022-07-10 NOTE — Progress Notes (Signed)
____________________________________________________________  Attending physician addendum:  Thank you for sending this case to me. I have reviewed the entire note and agree with the plan.   Horace Wishon Danis, MD  ____________________________________________________________  

## 2022-07-10 NOTE — Progress Notes (Signed)
07/10/2022 JURLEAN BICKELL [id number] Aug 15, 1940   HISTORY OF PRESENT ILLNESS: This is a pleasant 82 year old female who is new to our office.  She has been referred here by her PCP, Raelyn Number, PA, for evaluation of chronic constipation.  The patient tells me that she has just been having issues with constipation recently.  She has also been having increase in gas.  She says that since making this appointment about a month ago, however, her symptoms have subsided and significantly improved.  She says that she has been using 1/4 teaspoon extra virgin olive oil daily and that has been helping her move her bowels well.  She says that she got some Beano and was using that.  She says that did not really seem to help much, but the gas has subsided on its own.  Tells me that she had a colonoscopy in 2014 and 2004 that were both normal without polyps.  These were performed by Dr. Earlean Shawl.  She denies any rectal bleeding.  No abdominal pain.  Says that her weight is about 5 pounds down from her usual, but looks like her weight has bounced around between 115 and 122 pounds over the last 4 years or so (was 118 pounds today).   Past Medical History:  Diagnosis Date   Anxiety    Cancer ([place]) 2008   VULVAR CARCINOMA INSITU   Costochondritis    Diabetes ([place])    Diabetes mellitus without complication ([place])    pt. reports borderline    Diverticulosis    Gall stones    High cholesterol    Hypertension    Osteopenia    Osteoporosis    Reynolds syndrome ([place])    Shingles    Thyroid disease    Past Surgical History:  Procedure Laterality Date   ABDOMINAL HYSTERECTOMY     CATARACT EXTRACTION     CHOLECYSTECTOMY N/A 05/03/2014   Procedure: LAPAROSCOPIC CHOLECYSTECTOMY WITH INTRAOPERATIVE CHOLANGIOGRAM;  Surgeon: Autumn Messing III, MD;  Location: [facility];  Service: General;  Laterality: N/A;   COLONOSCOPY  December 16 2012   [place]  2014   HEMORROIDECTOMY      reports that she has never  smoked. She has never used smokeless tobacco. She reports that she does not drink alcohol and does not use drugs. family history includes Diabetes in her cousin; Heart attack in her brother; Hypertension in her brother, mother, and sister. Allergies  Allergen Reactions   Ampicillin Nausea And Vomiting    Dizzines    Doxycycline Hyclate    Lamisil [Terbinafine]     numbness   Codeine Nausea And Vomiting      Outpatient Encounter Medications as of 07/10/2022  Medication Sig   alendronate (FOSAMAX) 70 MG tablet Take 1 tablet (70 mg total) by mouth once a week. Take with a full glass of water on an empty stomach.   aspirin 81 MG tablet Take 81 mg by mouth daily.   cholecalciferol (VITAMIN D) 1000 UNITS tablet Take 2,000 Units by mouth daily.    Coenzyme Q10 (CO Q-10) 100 MG CAPS 1 cap(s)   fish oil-omega-3 fatty acids 1000 MG capsule Take 1 g by mouth 2 (two) times a day.    levothyroxine (SYNTHROID) 25 MCG tablet 1 tab(s)   losartan (COZAAR) 100 MG tablet 1 tab(s)   Menaquinone-7 (VITAMIN K2 PO) Take by mouth.   metFORMIN (GLUCOPHAGE) 500 MG tablet Take by mouth 2 (two) times daily with a meal.  metoprolol succinate (TOPROL-XL) 25 MG 24 hr tablet 1 tab(s)   Multiple Vitamin (MULTIVITAMIN) capsule Take 1 capsule by mouth daily.   niacin (NIASPAN) 1000 MG CR tablet Take 1,000 mg by mouth daily.   simvastatin (ZOCOR) 10 MG tablet Take 10 mg by mouth at bedtime.    thiothixene (NAVANE) 2 MG capsule Take 2 mg by mouth. TWICE A WEEK   valACYclovir (VALTREX) 500 MG tablet TAKE 1 TABLET BY MOUTH DAILY (PROPHYLACTIC)   vitamin B-12 (CYANOCOBALAMIN) 1000 MCG tablet 1 tab(s)   Accu-Chek FastClix Lancets MISC USE LANCETS TO CHECK BLOOD SUGAR TID (Patient not taking: Reported on 07/10/2022)   ACCU-CHEK GUIDE test strip USE TEST STRIPS TO CHECK BLOOD SUGAR TID (Patient not taking: Reported on 07/10/2022)   estradiol (ESTRACE) 0.1 MG/GM vaginal cream Place vaginally. (Patient not taking: Reported on  07/10/2022)   mirtazapine (REMERON) 7.5 MG tablet Take 7.5 mg by mouth at bedtime. (Patient not taking: Reported on 07/10/2022)   Polyvinyl Alcohol-Povidone (REFRESH OP) Apply to eye. (Patient not taking: Reported on 07/10/2022)   Probiotic Product ([employer] SENIOR PROBIOTIC) CAPS Take by mouth daily. (Patient not taking: Reported on 07/10/2022)   valACYclovir (VALTREX) 1000 MG tablet 1 tab(s) (Patient not taking: Reported on 07/10/2022)   No facility-administered encounter medications on file as of 07/10/2022.     REVIEW OF SYSTEMS  : All other systems reviewed and negative except where noted in the History of Present Illness.   PHYSICAL EXAM: BP 114/74   Pulse 93   Ht 5' (1.524 m)   Wt 118 lb (53.5 kg)   BMI 23.05 kg/m  General: Well developed AA female in no acute distress Head: Normocephalic and atraumatic Eyes:  Sclerae anicteric, conjunctiva pink. Ears: Normal auditory acuity Lungs: Clear throughout to auscultation; no W/R/R. Heart: Regular rate and rhythm; no M/R/G. Abdomen: Soft, non-distended.  BS present.  Non-tender. Musculoskeletal: Symmetrical with no gross deformities  Skin: No lesions on visible extremities Extremities: No edema  Neurological: Alert oriented x 4, grossly non-focal Psychological:  Alert and cooperative. Normal mood and affect  ASSESSMENT AND PLAN: *82 year old female with complaints of constipation and gas/bloating.  She actually says that her symptoms have improved/resolved over the past month since making this appointment.  She has been using a 1/4 teaspoon of extra virgin olive oil daily and that has been helping her move her bowels.  She did get some Beano and was taking that, but the flatulence seems to have decreased as well.  She can continue current regimen.  Was given samples of IBgard to try prn for gas as well.  We discussed colonoscopy, but she had one in 2014 and one in 2004 that were reportedly normal without polyps.  We discussed that she is very  low risk to developing any polyps at this point.  By the end of our conversation she decided to defer the procedure at this time.  She will let us know if she has any recurrent, ongoing, or new issues at which time we could certainly see her back and reconsider any type of evaluation.  CC:  Trey Sailors, [place]

## 2022-07-29 DIAGNOSIS — G4709 Other insomnia: Secondary | ICD-10-CM | POA: Diagnosis not present

## 2022-07-29 DIAGNOSIS — E119 Type 2 diabetes mellitus without complications: Secondary | ICD-10-CM | POA: Diagnosis not present

## 2022-07-29 DIAGNOSIS — I10 Essential (primary) hypertension: Secondary | ICD-10-CM | POA: Diagnosis not present

## 2022-07-29 DIAGNOSIS — E7800 Pure hypercholesterolemia, unspecified: Secondary | ICD-10-CM | POA: Diagnosis not present

## 2022-07-29 DIAGNOSIS — R2689 Other abnormalities of gait and mobility: Secondary | ICD-10-CM | POA: Diagnosis not present

## 2022-07-29 DIAGNOSIS — R42 Dizziness and giddiness: Secondary | ICD-10-CM | POA: Diagnosis not present

## 2022-07-29 DIAGNOSIS — E559 Vitamin D deficiency, unspecified: Secondary | ICD-10-CM | POA: Diagnosis not present

## 2022-07-29 DIAGNOSIS — M818 Other osteoporosis without current pathological fracture: Secondary | ICD-10-CM | POA: Diagnosis not present

## 2022-07-29 DIAGNOSIS — R29898 Other symptoms and signs involving the musculoskeletal system: Secondary | ICD-10-CM | POA: Diagnosis not present

## 2022-07-29 DIAGNOSIS — E038 Other specified hypothyroidism: Secondary | ICD-10-CM | POA: Diagnosis not present

## 2022-08-12 DIAGNOSIS — R29898 Other symptoms and signs involving the musculoskeletal system: Secondary | ICD-10-CM | POA: Diagnosis not present

## 2022-08-12 DIAGNOSIS — G4709 Other insomnia: Secondary | ICD-10-CM | POA: Diagnosis not present

## 2022-08-12 DIAGNOSIS — E119 Type 2 diabetes mellitus without complications: Secondary | ICD-10-CM | POA: Diagnosis not present

## 2022-08-12 DIAGNOSIS — I10 Essential (primary) hypertension: Secondary | ICD-10-CM | POA: Diagnosis not present

## 2022-08-12 DIAGNOSIS — E7800 Pure hypercholesterolemia, unspecified: Secondary | ICD-10-CM | POA: Diagnosis not present

## 2022-08-12 DIAGNOSIS — E559 Vitamin D deficiency, unspecified: Secondary | ICD-10-CM | POA: Diagnosis not present

## 2022-08-12 DIAGNOSIS — E038 Other specified hypothyroidism: Secondary | ICD-10-CM | POA: Diagnosis not present

## 2022-08-12 DIAGNOSIS — R42 Dizziness and giddiness: Secondary | ICD-10-CM | POA: Diagnosis not present

## 2022-08-12 DIAGNOSIS — M818 Other osteoporosis without current pathological fracture: Secondary | ICD-10-CM | POA: Diagnosis not present

## 2022-09-09 ENCOUNTER — Other Ambulatory Visit: Payer: Self-pay | Admitting: Obstetrics & Gynecology

## 2022-09-09 DIAGNOSIS — E119 Type 2 diabetes mellitus without complications: Secondary | ICD-10-CM | POA: Diagnosis not present

## 2022-09-09 DIAGNOSIS — R42 Dizziness and giddiness: Secondary | ICD-10-CM | POA: Diagnosis not present

## 2022-09-09 DIAGNOSIS — E559 Vitamin D deficiency, unspecified: Secondary | ICD-10-CM | POA: Diagnosis not present

## 2022-09-09 DIAGNOSIS — E7800 Pure hypercholesterolemia, unspecified: Secondary | ICD-10-CM | POA: Diagnosis not present

## 2022-09-09 DIAGNOSIS — G4709 Other insomnia: Secondary | ICD-10-CM | POA: Diagnosis not present

## 2022-09-09 DIAGNOSIS — I10 Essential (primary) hypertension: Secondary | ICD-10-CM | POA: Diagnosis not present

## 2022-09-09 DIAGNOSIS — M818 Other osteoporosis without current pathological fracture: Secondary | ICD-10-CM | POA: Diagnosis not present

## 2022-09-09 DIAGNOSIS — E038 Other specified hypothyroidism: Secondary | ICD-10-CM | POA: Diagnosis not present

## 2022-09-09 DIAGNOSIS — R29898 Other symptoms and signs involving the musculoskeletal system: Secondary | ICD-10-CM | POA: Diagnosis not present

## 2022-09-10 NOTE — Telephone Encounter (Signed)
Medication refill request: fosamax Last AEX:  05-07-21 Next AEX: 10-23-22 (43yr medication check) Last MMG (if hormonal medication request): n/a Refill authorized: please approve if appropriate

## 2022-10-23 ENCOUNTER — Encounter: Payer: Self-pay | Admitting: Obstetrics & Gynecology

## 2022-10-23 ENCOUNTER — Ambulatory Visit (INDEPENDENT_AMBULATORY_CARE_PROVIDER_SITE_OTHER): Payer: Medicare HMO | Admitting: Obstetrics & Gynecology

## 2022-10-23 VITALS — BP 110/80 | HR 61 | Ht 60.25 in | Wt 112.0 lb

## 2022-10-23 DIAGNOSIS — Z780 Asymptomatic menopausal state: Secondary | ICD-10-CM

## 2022-10-23 DIAGNOSIS — M8589 Other specified disorders of bone density and structure, multiple sites: Secondary | ICD-10-CM | POA: Diagnosis not present

## 2022-10-23 DIAGNOSIS — Z90710 Acquired absence of both cervix and uterus: Secondary | ICD-10-CM | POA: Diagnosis not present

## 2022-10-23 NOTE — Progress Notes (Addendum)
Linda Barber 09/24/1940 [id number]   History:    82 y.o. G2P2L2    RP: Counseling on treatment and management of Osteoporosis/Osteopenia with Fosamax   HPI: Postmenopause, well on no hormone replacement therapy.  History of total abdominal hysterectomy.  H/O Osteoporosis, improved to Osteopenia on Fosamax. Fosamax holiday x almost 2 years from 09/2019 until 07/2021 when she was restarted on it. Ca++, Vit D, walking. No recent fall. No current fracture. Osteopenia on last BD 05/2022, Lt total Hip T-Score -1.8, table at all sites.    No pelvic pain.  Abstinent.  Pap Neg 04/2018.  No HSV recurrence x >3 years.  History of VIN 3.  Status post wide excision with negative margins many years ago.  H/O Osteoporosis, improved to Osteopenia on Fosamax. Fosamax holiday x almost 2 years from 09/2019 until 07/2021 when she was restarted on it. Ca++, Vit D, walking. No recent fall. No current fracture. Osteopenia on last BD 05/2022, Lt total Hip T-Score -1.8, table at all sites.  Bowel movements normal. Breasts normal. Mammo 12/2021 Neg.  Body mass index 21.69.  Health labs with family physician.  [place] 2016.   Past medical history,surgical history, family history and social history were all reviewed and documented in the EPIC chart.  Gynecologic History No LMP recorded. Patient has had a hysterectomy.  Obstetric History OB History  Gravida Para Term Preterm AB Living  2 2 2     2   SAB IAB Ectopic Multiple Live Births          1    # Outcome Date GA Lbr Len/2nd Weight Sex Delivery Anes PTL Lv  2 Term           1 Term              ROS: A ROS was performed and pertinent positives and negatives are included in the history. GENERAL: No fevers or chills. HEENT: No change in vision, no earache, sore throat or sinus congestion. NECK: No pain or stiffness. CARDIOVASCULAR: No chest pain or pressure. No palpitations. PULMONARY: No shortness of breath, cough or wheeze. GASTROINTESTINAL: No abdominal pain,  nausea, vomiting or diarrhea, melena or bright red blood per rectum. GENITOURINARY: No urinary frequency, urgency, hesitancy or dysuria. MUSCULOSKELETAL: No joint or muscle pain, no back pain, no recent trauma. DERMATOLOGIC: No rash, no itching, no lesions. ENDOCRINE: No polyuria, polydipsia, no heat or cold intolerance. No recent change in weight. HEMATOLOGICAL: No anemia or easy bruising or bleeding. NEUROLOGIC: No headache, seizures, numbness, tingling or weakness. PSYCHIATRIC: No depression, no loss of interest in normal activity or change in sleep pattern.     Exam:   BP 110/80   Pulse 61   Ht 5' 0.25" (1.53 m)   Wt 112 lb (50.8 kg)   SpO2 99%   BMI 21.69 kg/m   Body mass index is 21.69 kg/m.  General appearance : Well developed well nourished female. No acute distress Heart: Regular rate and rhythm, no murmurs or gallops Breast:Examined in sitting and supine position were symmetrical in appearance, no palpable masses or tenderness,  no skin retraction, no nipple inversion, no nipple discharge, no skin discoloration, no axillary or supraclavicular lymphadenopathy Abdomen: no palpable masses or tenderness, no rebound or guarding Extremities: no edema or skin discoloration or tenderness  Pelvic: Vulva: Normal             Vagina: No gross lesions or discharge  Cervix/Uterus absent  Adnexa  Without masses or tenderness  Anus: Normal   Assessment/Plan:  82 y.o. female   1. Osteopenia of multiple sites Postmenopause, well on no hormone replacement therapy.  History of total abdominal hysterectomy.  H/O Osteoporosis, improved to Osteopenia on Fosamax. Fosamax holiday x almost 2 years from 09/2019 until 07/2021 when she was restarted on it. Ca++, Vit D, walking. No recent fall. No current fracture. Osteopenia on last BD 05/2022, Lt total Hip T-Score -1.8, table at all sites.  No CI to continue on Fosamax, prescription sent to pharmacy.  Repeat BD at [facility] in 05/2023.  2.  Postmenopausal Postmenopause, well on no hormone replacement therapy.  History of total abdominal hysterectomy. No pelvic pain.  C/O longstanding irritation/pain after passing urine.  Seen by Urology and prescribed an Estrogen cream which patient stopped.  Recommend using Replens.  Usage reviewed, thin application on vulva and periurethral area twice a day.    3. S/P TAH (total abdominal hysterectomy)  Other orders - metoprolol tartrate (LOPRESSOR) 25 MG tablet; Take 25 mg by mouth 2 (two) times daily.   Genia Del MD, 8:40 AM

## 2022-11-06 ENCOUNTER — Telehealth: Payer: Self-pay

## 2022-11-06 NOTE — Telephone Encounter (Signed)
Pt LVM in triage line stating she needs order for DEXA filled out an faxed to [facility].   Last seen for 8yr visit on 10/23/2022--recall for 10/2023 for B&P.  Per OV notes: DEXA in 05/2023. Order filled out and placed on Dr. Sharol Roussel desk for authorization.

## 2022-11-06 NOTE — Telephone Encounter (Signed)
Order signed and faxed successfully to [facility]. Pt notified and voiced understanding. Appt scheduled for 06/12/2023.  Will route to provider for final review and close encounter.

## 2022-11-24 DIAGNOSIS — I10 Essential (primary) hypertension: Secondary | ICD-10-CM | POA: Diagnosis not present

## 2022-11-24 DIAGNOSIS — M818 Other osteoporosis without current pathological fracture: Secondary | ICD-10-CM | POA: Diagnosis not present

## 2022-11-24 DIAGNOSIS — E038 Other specified hypothyroidism: Secondary | ICD-10-CM | POA: Diagnosis not present

## 2022-11-24 DIAGNOSIS — G4709 Other insomnia: Secondary | ICD-10-CM | POA: Diagnosis not present

## 2022-11-24 DIAGNOSIS — R42 Dizziness and giddiness: Secondary | ICD-10-CM | POA: Diagnosis not present

## 2022-11-24 DIAGNOSIS — Z0001 Encounter for general adult medical examination with abnormal findings: Secondary | ICD-10-CM | POA: Diagnosis not present

## 2022-11-24 DIAGNOSIS — E559 Vitamin D deficiency, unspecified: Secondary | ICD-10-CM | POA: Diagnosis not present

## 2022-11-24 DIAGNOSIS — E7800 Pure hypercholesterolemia, unspecified: Secondary | ICD-10-CM | POA: Diagnosis not present

## 2022-11-24 DIAGNOSIS — E119 Type 2 diabetes mellitus without complications: Secondary | ICD-10-CM | POA: Diagnosis not present

## 2022-12-01 ENCOUNTER — Other Ambulatory Visit: Payer: Self-pay | Admitting: Obstetrics & Gynecology

## 2022-12-01 NOTE — Telephone Encounter (Signed)
Med refill request: Fosamax Last seen: 10/23/22 Next AEX: 05/11/23 cancelled Last MMG (if hormonal med) 01/01/22 Refill authorized: Please Advise?

## 2022-12-16 ENCOUNTER — Ambulatory Visit: Payer: Medicare HMO | Admitting: Gastroenterology

## 2022-12-24 ENCOUNTER — Other Ambulatory Visit: Payer: Self-pay | Admitting: Obstetrics & Gynecology

## 2022-12-24 DIAGNOSIS — B009 Herpesviral infection, unspecified: Secondary | ICD-10-CM

## 2022-12-24 NOTE — Telephone Encounter (Signed)
Medication refill request: valtrex 500mg  Last AEX:  05-07-21, had 64yr med f/u on 10-23-22 Next AEX: not scheduled Last MMG (if hormonal medication request): n/a Refill authorized: please approve or deny as appropriate

## 2022-12-29 DIAGNOSIS — Z1231 Encounter for screening mammogram for malignant neoplasm of breast: Secondary | ICD-10-CM | POA: Diagnosis not present

## 2022-12-29 LAB — HM MAMMOGRAPHY

## 2023-02-20 ENCOUNTER — Other Ambulatory Visit: Payer: Self-pay | Admitting: Obstetrics & Gynecology

## 2023-02-20 NOTE — Telephone Encounter (Signed)
Med refill request: Fosamax Last AEX: 05/07/21; Last OV: 10/23/22 Next AEX: not scheduled Last MMG (if hormonal med) 01/01/22 Refill authorized: Please Advise, #12, 0 RF

## 2023-02-23 DIAGNOSIS — I10 Essential (primary) hypertension: Secondary | ICD-10-CM | POA: Diagnosis not present

## 2023-02-23 DIAGNOSIS — M818 Other osteoporosis without current pathological fracture: Secondary | ICD-10-CM | POA: Diagnosis not present

## 2023-02-23 DIAGNOSIS — E7800 Pure hypercholesterolemia, unspecified: Secondary | ICD-10-CM | POA: Diagnosis not present

## 2023-02-23 DIAGNOSIS — G4709 Other insomnia: Secondary | ICD-10-CM | POA: Diagnosis not present

## 2023-02-23 DIAGNOSIS — E119 Type 2 diabetes mellitus without complications: Secondary | ICD-10-CM | POA: Diagnosis not present

## 2023-02-23 DIAGNOSIS — E038 Other specified hypothyroidism: Secondary | ICD-10-CM | POA: Diagnosis not present

## 2023-02-23 DIAGNOSIS — E559 Vitamin D deficiency, unspecified: Secondary | ICD-10-CM | POA: Diagnosis not present

## 2023-03-18 ENCOUNTER — Encounter: Payer: Self-pay | Admitting: Gastroenterology

## 2023-03-18 ENCOUNTER — Ambulatory Visit: Payer: Medicare HMO | Admitting: Gastroenterology

## 2023-03-18 VITALS — BP 126/60 | HR 60 | Ht 60.0 in | Wt 117.2 lb

## 2023-03-18 DIAGNOSIS — K5900 Constipation, unspecified: Secondary | ICD-10-CM | POA: Diagnosis not present

## 2023-03-18 NOTE — Patient Instructions (Signed)
May use smooth move tea as needed.   Follow up as needed.  _______________________________________________________  If your blood pressure at your visit was 140/90 or greater, please contact your primary care physician to follow up on this.  _______________________________________________________  If you are age 81 or older, your body mass index should be between 23-30. Your Body mass index is 22.9 kg/m. If this is out of the aforementioned range listed, please consider follow up with your Primary Care Provider.  If you are age 4 or younger, your body mass index should be between 19-25. Your Body mass index is 22.9 kg/m. If this is out of the aformentioned range listed, please consider follow up with your Primary Care Provider.   ________________________________________________________  The [facility] providers would like to encourage you to use [facility] to communicate with providers for non-urgent requests or questions.  Due to long hold times on the telephone, sending your provider a message by [facility] may be a faster and more efficient way to get a response.  Please allow 48 business hours for a response.  Please remember that this is for non-urgent requests.  _______________________________________________________

## 2023-03-18 NOTE — Progress Notes (Signed)
03/18/2023 Linda Barber [id number] 09/24/1940   HISTORY OF PRESENT ILLNESS: This is a pleasant 82 year old female who was seen by me as an initial office visit in February of this year and her care was assigned to Dr. Myrtie Neither.  She was referred here for constipation.  When I saw her she was doing well with taking extra virgin olive oil to help manage that.  She said that really this appointment she wanted to touch base as she had not been seen here in several months.  She is currently taking 2 teaspoons of extra virgin olive oil daily, sometimes takes a third teaspoon if needed.  Drinks a lot of water, drinks warm water and has also recently started chewing fennel seeds on an as-needed basis to help with her constipation.  This regimen overall is working well for her.  She has bowel movements daily  Tells me that she had a colonoscopy in 2014 and 2004 that were both normal without polyps. These were performed by Dr. Kinnie Scales. She denies any rectal bleeding. No abdominal pain.  Her weight is stable.  We discussed colonoscopy earlier this year when she was 82 years old, but she declined to schedule at that time since she was doing well.  Past Medical History:  Diagnosis Date   Anxiety    Cancer ([facility]) 2008   VULVAR CARCINOMA INSITU   Costochondritis    Diabetes ([facility])    Diabetes mellitus without complication ([facility])    pt. reports borderline    Diverticulosis    Gall stones    High cholesterol    HSV infection    Hypertension    Hypothyroidism    Osteopenia    Osteoporosis    Reynolds syndrome ([facility])    Shingles    Past Surgical History:  Procedure Laterality Date   ABDOMINAL HYSTERECTOMY     CATARACT EXTRACTION     CHOLECYSTECTOMY N/A 05/03/2014   Procedure: LAPAROSCOPIC CHOLECYSTECTOMY WITH INTRAOPERATIVE CHOLANGIOGRAM;  Surgeon: Chevis Pretty III, MD;  Location: [facility] OR;  Service: General;  Laterality: N/A;   COLONOSCOPY  December 16 2012   HEMORRHOID SURGERY  2014   HEMORROIDECTOMY       reports that she has never smoked. She has never used smokeless tobacco. She reports that she does not drink alcohol and does not use drugs. family history includes Diabetes in her cousin; Heart attack in her brother; Hypertension in her brother, mother, and sister. Allergies  Allergen Reactions   Ampicillin Nausea And Vomiting    Dizzines    Doxycycline Hyclate    Lamisil [Terbinafine]     numbness   Codeine Nausea And Vomiting      Outpatient Encounter Medications as of 03/18/2023  Medication Sig   alendronate (FOSAMAX) 70 MG tablet TAKE 1 TABLET BY MOUTH ONCE A WEEK WITH A FULL GLASS OF WATER ON AN EMPTY STOMACH   aspirin 81 MG tablet Take 81 mg by mouth daily.   cholecalciferol (VITAMIN D) 1000 UNITS tablet Take 1,000 Units by mouth daily.   Coenzyme Q10 (CO Q-10) 100 MG CAPS 1 cap(s)   fish oil-omega-3 fatty acids 1000 MG capsule Take 1 g by mouth 2 (two) times a day.    Lancets (ONETOUCH DELICA PLUS LANCET33G) MISC Apply topically 3 (three) times daily.   levothyroxine (SYNTHROID) 25 MCG tablet 1 tab(s)   losartan (COZAAR) 100 MG tablet 1 tab(s)   Menaquinone-7 (VITAMIN K2 PO) Take by mouth.   metFORMIN (GLUCOPHAGE) 500 MG tablet  Take by mouth.   metoprolol tartrate (LOPRESSOR) 25 MG tablet Take 25 mg by mouth 2 (two) times daily.   Multiple Vitamin (MULTIVITAMIN) capsule Take 1 capsule by mouth daily.   niacin (NIASPAN) 1000 MG CR tablet Take 1,000 mg by mouth daily.   ONETOUCH ULTRA test strip    Polyvinyl Alcohol-Povidone (REFRESH OP) Apply to eye.   Probiotic Product ([employer]S SENIOR PROBIOTIC) CAPS Take by mouth daily.   thiothixene (NAVANE) 2 MG capsule Take 2 mg by mouth. TWICE A WEEK   valACYclovir (VALTREX) 500 MG tablet TAKE 1 TABLET BY MOUTH DAILY (PROPHYLACTIC)   vitamin B-12 (CYANOCOBALAMIN) 1000 MCG tablet 1 tab(s)   No facility-administered encounter medications on file as of 03/18/2023.    REVIEW OF SYSTEMS  : All other systems reviewed and negative except where  noted in the History of Present Illness.   PHYSICAL EXAM: BP 126/60   Pulse 60   Ht 5' (1.524 m)   Wt 117 lb 4 oz (53.2 kg)   BMI 22.90 kg/m  General: Well developed female in no acute distress Head: Normocephalic and atraumatic Eyes:  Sclerae anicteric, conjunctiva pink. Ears: Normal auditory acuity Lungs: Clear throughout to auscultation; no W/R/R. Heart: Regular rate and rhythm; no M/R/G. Musculoskeletal: Symmetrical with no gross deformities  Skin: No lesions on visible extremities Neurological: Alert oriented x 4, grossly non-focal Psychological:  Alert and cooperative. Normal mood and affect  ASSESSMENT AND PLAN: *Constipation: Controlled by taking 2 teaspoons of extra virgin olive oil daily.  Has also been chewing on Fennel seeds as needed, which helps if she feels somewhat constipated.  Is having bowel movements daily though with that regimen.  Advised to be sure that she is drinking a lot of water, eating fruits and vegetables including pears, plums, peaches.  Can snack on prunes as well.  Could try smooth move herbal tea if needed.  Otherwise she is doing well with this regimen and she will continue it for now.  Can follow-up here as needed.   CC:  Norm Salt, [place]

## 2023-03-19 NOTE — Progress Notes (Signed)
____________________________________________________________  Attending physician addendum:  Thank you for sending this case to me. I have reviewed the entire note and agree with the plan.   Malyssa Maris Danis, MD  ____________________________________________________________  

## 2023-04-06 DIAGNOSIS — E119 Type 2 diabetes mellitus without complications: Secondary | ICD-10-CM | POA: Diagnosis not present

## 2023-04-06 DIAGNOSIS — Z0100 Encounter for examination of eyes and vision without abnormal findings: Secondary | ICD-10-CM | POA: Diagnosis not present

## 2023-04-06 DIAGNOSIS — Z961 Presence of intraocular lens: Secondary | ICD-10-CM | POA: Diagnosis not present

## 2023-05-07 DIAGNOSIS — I872 Venous insufficiency (chronic) (peripheral): Secondary | ICD-10-CM | POA: Diagnosis not present

## 2023-05-11 ENCOUNTER — Ambulatory Visit: Payer: Medicare HMO | Admitting: Obstetrics & Gynecology

## 2023-05-11 ENCOUNTER — Telehealth: Payer: Self-pay | Admitting: Obstetrics and Gynecology

## 2023-05-11 ENCOUNTER — Other Ambulatory Visit: Payer: Self-pay | Admitting: Obstetrics and Gynecology

## 2023-05-11 NOTE — Telephone Encounter (Signed)
Med refill request: Fosamax Last AEX: 05/07/21, Last OV: 10/23/22 Next AEX: cancelled 05/11/23 Last MMG (if hormonal med) 01/01/22 Refill authorized: Please Advise, #12, 0 RF

## 2023-05-11 NOTE — Telephone Encounter (Signed)
Refill sent for fosamax. Left message of max time 3-5 years and then holiday.  I believe she restarted in 2021. Can run PA for prolia and left message on r/b/a/I of the medication. Refill sent. Continue fosamax until PA is approved. Dr. Karma Greaser

## 2023-05-15 ENCOUNTER — Telehealth: Payer: Self-pay

## 2023-05-15 NOTE — Telephone Encounter (Signed)
F/u. PA was not yet started. Called pt's pharmacy to see the status of Fosamax. LVMTCB

## 2023-05-15 NOTE — Telephone Encounter (Signed)
Pt called looking for refill of Fosamax. Fosamax PA has been initiated. Reassured pt of delay. Pt would like to receive call back regarding time frame.

## 2023-05-25 DIAGNOSIS — E038 Other specified hypothyroidism: Secondary | ICD-10-CM | POA: Diagnosis not present

## 2023-05-25 DIAGNOSIS — I10 Essential (primary) hypertension: Secondary | ICD-10-CM | POA: Diagnosis not present

## 2023-05-25 DIAGNOSIS — E7800 Pure hypercholesterolemia, unspecified: Secondary | ICD-10-CM | POA: Diagnosis not present

## 2023-05-25 DIAGNOSIS — E119 Type 2 diabetes mellitus without complications: Secondary | ICD-10-CM | POA: Diagnosis not present

## 2023-05-25 DIAGNOSIS — G4709 Other insomnia: Secondary | ICD-10-CM | POA: Diagnosis not present

## 2023-05-25 DIAGNOSIS — M818 Other osteoporosis without current pathological fracture: Secondary | ICD-10-CM | POA: Diagnosis not present

## 2023-05-25 DIAGNOSIS — E559 Vitamin D deficiency, unspecified: Secondary | ICD-10-CM | POA: Diagnosis not present

## 2023-05-26 ENCOUNTER — Telehealth: Payer: Self-pay | Admitting: *Deleted

## 2023-05-26 DIAGNOSIS — M810 Age-related osteoporosis without current pathological fracture: Secondary | ICD-10-CM

## 2023-05-26 LAB — LAB REPORT - SCANNED
A1c: 6
Albumin, Urine POC: 0.6
Creatinine, POC: 67 mg/dL
EGFR: 84
Microalb Creat Ratio: 9

## 2023-05-26 NOTE — Telephone Encounter (Signed)
 Per pharmacy, patient picked up a supply of fosamax on 05-18-23.

## 2023-05-26 NOTE — Telephone Encounter (Signed)
 Routing to Joy to f/u on PA for Fosamax.

## 2023-05-26 NOTE — Telephone Encounter (Signed)
Insurance information submitted to [employer] portal. Will await summary of benefits for prolia.    

## 2023-05-26 NOTE — Telephone Encounter (Signed)
-----   Message from Almarie MARLA Carpen sent at 05/11/2023 10:04 AM EST ----- Spoke with patient. She is likely up to the 3-5 years again of fosamax  and will need a holiday soon from it.  Can we run prolia  benefits. Last dxa 2024 and -1.8 Thank you Damien Dr. Carpen

## 2023-06-03 NOTE — Telephone Encounter (Signed)
 PA for Prolia  signed by Dr. Tia Flowers and faxed to [facility] at [phone]. Will await response.

## 2023-06-03 NOTE — Telephone Encounter (Signed)
 PA form filled out for prolia  and taken to Dr. Gioia Laity desk for review and signature. Will fax once signed.

## 2023-06-04 NOTE — Telephone Encounter (Signed)
Deductible:  none   OOP MAX: $4150   OV for osteoporosis: 10-23-22 ML  Calcium: 10.2           Date: 05/25/23 drawn at PCP office-- will bring labs to appointment on 06/18/23  Upcoming dental procedures: No   Hx of Kidney Disease: No   Last Bone Density Scan: 06-10-22  Is Prior Authorization needed: yes approved by Drue Dun  Pt estimated Cost: $390   Coverage Details: Prolia will be subject to 20% coinsurance up to a $4150 OOP max($0 met). Once met, coverage increases to 100%. Administration will be subject to a $30 copay, which does contribute to OOP max. No deductible applies.

## 2023-06-04 NOTE — Telephone Encounter (Signed)
[employer] authorized prescription drug prolia. Authorization #: [id number].

## 2023-06-04 NOTE — Telephone Encounter (Signed)
Message left to return call to [place] at [phone].   Patient needs updated labs prior to prolia injection.

## 2023-06-05 ENCOUNTER — Other Ambulatory Visit: Payer: Self-pay | Admitting: Nurse Practitioner

## 2023-06-05 DIAGNOSIS — B009 Herpesviral infection, unspecified: Secondary | ICD-10-CM

## 2023-06-05 NOTE — Telephone Encounter (Signed)
Med refill request: valtrex Last AEX: 05/07/2021 Next AEX: 11/02/2023 Last MMG (if hormonal med) Refill authorized: Last Rx sent #[phone] with 1 refill on 12/24/2022. Please approve or deny as appropriate.

## 2023-06-08 DIAGNOSIS — G4709 Other insomnia: Secondary | ICD-10-CM | POA: Diagnosis not present

## 2023-06-08 DIAGNOSIS — E7800 Pure hypercholesterolemia, unspecified: Secondary | ICD-10-CM | POA: Diagnosis not present

## 2023-06-08 DIAGNOSIS — E559 Vitamin D deficiency, unspecified: Secondary | ICD-10-CM | POA: Diagnosis not present

## 2023-06-08 DIAGNOSIS — E119 Type 2 diabetes mellitus without complications: Secondary | ICD-10-CM | POA: Diagnosis not present

## 2023-06-08 DIAGNOSIS — I10 Essential (primary) hypertension: Secondary | ICD-10-CM | POA: Diagnosis not present

## 2023-06-08 DIAGNOSIS — M818 Other osteoporosis without current pathological fracture: Secondary | ICD-10-CM | POA: Diagnosis not present

## 2023-06-08 DIAGNOSIS — E038 Other specified hypothyroidism: Secondary | ICD-10-CM | POA: Diagnosis not present

## 2023-06-09 MED ORDER — DENOSUMAB 60 MG/ML ~~LOC~~ SOSY
60.0000 mg | PREFILLED_SYRINGE | Freq: Once | SUBCUTANEOUS | Status: AC
  Administered 2023-06-18: 60 mg via SUBCUTANEOUS

## 2023-06-09 NOTE — Telephone Encounter (Signed)
Call to patient. Summary of benefits reviewed with patient for prolia and she verbalized understanding. Patient had labs drawn at PCP office on 05/25/23. Has a physical copy of labs she will bring to her appointment on 06/18/23. Order placed for prolia and summary of benefits scanned into Epic.   Encounter closed.

## 2023-06-12 DIAGNOSIS — N958 Other specified menopausal and perimenopausal disorders: Secondary | ICD-10-CM | POA: Diagnosis not present

## 2023-06-12 DIAGNOSIS — M8588 Other specified disorders of bone density and structure, other site: Secondary | ICD-10-CM | POA: Diagnosis not present

## 2023-06-12 DIAGNOSIS — E2839 Other primary ovarian failure: Secondary | ICD-10-CM | POA: Diagnosis not present

## 2023-06-15 ENCOUNTER — Encounter: Payer: Self-pay | Admitting: Obstetrics and Gynecology

## 2023-06-18 ENCOUNTER — Ambulatory Visit (INDEPENDENT_AMBULATORY_CARE_PROVIDER_SITE_OTHER): Payer: Medicare HMO

## 2023-06-18 ENCOUNTER — Telehealth: Payer: Self-pay

## 2023-06-18 DIAGNOSIS — M810 Age-related osteoporosis without current pathological fracture: Secondary | ICD-10-CM

## 2023-06-18 NOTE — Telephone Encounter (Signed)
Pt left copy of labs at Prolia inj appt 06/18/23. Given to TW for 6/25 appt

## 2023-08-31 DIAGNOSIS — M818 Other osteoporosis without current pathological fracture: Secondary | ICD-10-CM | POA: Diagnosis not present

## 2023-08-31 DIAGNOSIS — G4709 Other insomnia: Secondary | ICD-10-CM | POA: Diagnosis not present

## 2023-08-31 DIAGNOSIS — I10 Essential (primary) hypertension: Secondary | ICD-10-CM | POA: Diagnosis not present

## 2023-08-31 DIAGNOSIS — E7800 Pure hypercholesterolemia, unspecified: Secondary | ICD-10-CM | POA: Diagnosis not present

## 2023-08-31 DIAGNOSIS — E038 Other specified hypothyroidism: Secondary | ICD-10-CM | POA: Diagnosis not present

## 2023-08-31 DIAGNOSIS — E559 Vitamin D deficiency, unspecified: Secondary | ICD-10-CM | POA: Diagnosis not present

## 2023-08-31 DIAGNOSIS — E119 Type 2 diabetes mellitus without complications: Secondary | ICD-10-CM | POA: Diagnosis not present

## 2023-08-31 DIAGNOSIS — L299 Pruritus, unspecified: Secondary | ICD-10-CM | POA: Diagnosis not present

## 2023-09-14 DIAGNOSIS — E119 Type 2 diabetes mellitus without complications: Secondary | ICD-10-CM | POA: Diagnosis not present

## 2023-09-14 DIAGNOSIS — E038 Other specified hypothyroidism: Secondary | ICD-10-CM | POA: Diagnosis not present

## 2023-09-14 DIAGNOSIS — E7800 Pure hypercholesterolemia, unspecified: Secondary | ICD-10-CM | POA: Diagnosis not present

## 2023-09-14 DIAGNOSIS — M818 Other osteoporosis without current pathological fracture: Secondary | ICD-10-CM | POA: Diagnosis not present

## 2023-09-14 DIAGNOSIS — I10 Essential (primary) hypertension: Secondary | ICD-10-CM | POA: Diagnosis not present

## 2023-09-14 DIAGNOSIS — E559 Vitamin D deficiency, unspecified: Secondary | ICD-10-CM | POA: Diagnosis not present

## 2023-09-14 DIAGNOSIS — M722 Plantar fascial fibromatosis: Secondary | ICD-10-CM | POA: Diagnosis not present

## 2023-09-14 DIAGNOSIS — G4709 Other insomnia: Secondary | ICD-10-CM | POA: Diagnosis not present

## 2023-11-02 ENCOUNTER — Encounter: Payer: Self-pay | Admitting: Nurse Practitioner

## 2023-11-02 ENCOUNTER — Ambulatory Visit (INDEPENDENT_AMBULATORY_CARE_PROVIDER_SITE_OTHER): Payer: Medicare HMO | Admitting: Nurse Practitioner

## 2023-11-02 VITALS — BP 112/68 | HR 72 | Ht 60.0 in | Wt 114.0 lb

## 2023-11-02 DIAGNOSIS — Z9289 Personal history of other medical treatment: Secondary | ICD-10-CM | POA: Diagnosis not present

## 2023-11-02 DIAGNOSIS — Z780 Asymptomatic menopausal state: Secondary | ICD-10-CM | POA: Diagnosis not present

## 2023-11-02 DIAGNOSIS — Z9189 Other specified personal risk factors, not elsewhere classified: Secondary | ICD-10-CM

## 2023-11-02 DIAGNOSIS — Z01419 Encounter for gynecological examination (general) (routine) without abnormal findings: Secondary | ICD-10-CM

## 2023-11-02 DIAGNOSIS — B009 Herpesviral infection, unspecified: Secondary | ICD-10-CM | POA: Diagnosis not present

## 2023-11-02 DIAGNOSIS — M8589 Other specified disorders of bone density and structure, multiple sites: Secondary | ICD-10-CM

## 2023-11-02 DIAGNOSIS — R300 Dysuria: Secondary | ICD-10-CM | POA: Diagnosis not present

## 2023-11-02 NOTE — Progress Notes (Signed)
   Linda Barber 06-20-1940 [id number]   History:  83 y.o. G2P2002 presents for breast and pelvic exam. Postmenopausal - no HRT. S/P TAH. H/O VIN 3 - S/P wide excision with negative margins years ago. Osteopenia, did Fosamax  Jan 2017-Jan 2025, switched to Prolia  in January. Still experiences burning when she pees, only at night, ongoing for years. Has seen urology with neg workup.   Gynecologic History No LMP recorded. Patient has had a hysterectomy.   Contraception: status post hysterectomy Sexually active: No  Health Maintenance Last Pap: 04/30/2018. Results were: Normal Last mammogram: July 2024. Results were: Normal per patient Last colonoscopy: 2016 Last Dexa: 06/12/2023. Results were: T-score -1.8, FRAX 5.7% / 1.5%  Past medical history, past surgical history, family history and social history were all reviewed and documented in the EPIC chart. Widowed. Active in church. 67 yo son. 1 granddaughter.   ROS:  A ROS was performed and pertinent positives and negatives are included.  Exam:  Vitals:   11/02/23 1416  BP: 112/68  Pulse: 72  SpO2: 97%  Weight: 114 lb (51.7 kg)  Height: 5' (1.524 m)   Body mass index is 22.26 kg/m.  Physical Exam Constitutional:      Appearance: Normal appearance.  Neck:     Thyroid : No thyroid  mass, thyromegaly or thyroid  tenderness.   Cardiovascular:     Rate and Rhythm: Normal rate and regular rhythm.  Pulmonary:     Effort: Pulmonary effort is normal.     Breath sounds: Normal breath sounds.  Chest:  Breasts:    Right: Normal.     Left: Normal.  Abdominal:     Palpations: Abdomen is soft.     Tenderness: There is no abdominal tenderness.  Genitourinary:    General: Normal vulva.     Vagina: Normal.     Uterus: Absent.      Adnexa: Right adnexa normal and left adnexa normal.     Comments: Atrophy present Lymphadenopathy:     Upper Body:     Right upper body: No supraclavicular or axillary adenopathy.     Left upper body: No  supraclavicular or axillary adenopathy.    Arvell Birchwood, CMA present as chaperone.   Assessment/Plan:  83 y.o. [id number] for breast and pelvic exam.   Encounter for breast and pelvic examination - Education provided on SBEs, importance of preventative screenings, current guidelines, high calcium diet, regular exercise, and multivitamin daily. Labs with PCP.   Postmenopausal - no HRT, no bleeding.   Burning with urination - could be from atrophy. Will try applying coconut oil nightly.  Osteopenia of multiple sites - T-score -1.8. did Fosamax  Jan 2017-Jan 2025, switched to Prolia  in January. Usually very active with walking on treadmill, but she has been dealing with plantar fascitis.   Screening for cervical cancer - VIN 3 years ago - S/P wide excision with negative margins years ago. No longer screening.   Screening for breast cancer - Normal mammogram history.  Continue annual screenings.  Normal breast exam today.  Screening for colon cancer - 2016 colonoscopy. No longer screening d/t age.   Return in about 1 year (around 11/01/2024) for B&P (high risk).    Andee Bamberger DNP, 2:59 PM 11/02/2023

## 2023-11-19 ENCOUNTER — Other Ambulatory Visit: Payer: Self-pay | Admitting: Radiology

## 2023-11-19 DIAGNOSIS — B009 Herpesviral infection, unspecified: Secondary | ICD-10-CM

## 2023-11-19 NOTE — Telephone Encounter (Signed)
 Med refill request: valacyclovir  500 mg Last AEX: B&P 11/02/23 Next AEX: 11/02/24 Last MMG (if hormonal med) n/a Refill authorized: Please Advise?

## 2023-11-24 DIAGNOSIS — Z0001 Encounter for general adult medical examination with abnormal findings: Secondary | ICD-10-CM | POA: Diagnosis not present

## 2023-11-24 DIAGNOSIS — M818 Other osteoporosis without current pathological fracture: Secondary | ICD-10-CM | POA: Diagnosis not present

## 2023-11-24 DIAGNOSIS — E038 Other specified hypothyroidism: Secondary | ICD-10-CM | POA: Diagnosis not present

## 2023-11-24 DIAGNOSIS — E7800 Pure hypercholesterolemia, unspecified: Secondary | ICD-10-CM | POA: Diagnosis not present

## 2023-11-24 DIAGNOSIS — E119 Type 2 diabetes mellitus without complications: Secondary | ICD-10-CM | POA: Diagnosis not present

## 2023-11-24 DIAGNOSIS — E559 Vitamin D deficiency, unspecified: Secondary | ICD-10-CM | POA: Diagnosis not present

## 2023-11-24 DIAGNOSIS — G4709 Other insomnia: Secondary | ICD-10-CM | POA: Diagnosis not present

## 2023-11-24 DIAGNOSIS — I10 Essential (primary) hypertension: Secondary | ICD-10-CM | POA: Diagnosis not present

## 2023-11-24 LAB — LAB REPORT - SCANNED
A1c: 6
EGFR: 87

## 2023-12-10 DIAGNOSIS — M818 Other osteoporosis without current pathological fracture: Secondary | ICD-10-CM | POA: Diagnosis not present

## 2023-12-10 DIAGNOSIS — E119 Type 2 diabetes mellitus without complications: Secondary | ICD-10-CM | POA: Diagnosis not present

## 2023-12-10 DIAGNOSIS — E871 Hypo-osmolality and hyponatremia: Secondary | ICD-10-CM | POA: Diagnosis not present

## 2023-12-10 DIAGNOSIS — G4709 Other insomnia: Secondary | ICD-10-CM | POA: Diagnosis not present

## 2023-12-10 DIAGNOSIS — E038 Other specified hypothyroidism: Secondary | ICD-10-CM | POA: Diagnosis not present

## 2023-12-10 DIAGNOSIS — I10 Essential (primary) hypertension: Secondary | ICD-10-CM | POA: Diagnosis not present

## 2023-12-10 DIAGNOSIS — E559 Vitamin D deficiency, unspecified: Secondary | ICD-10-CM | POA: Diagnosis not present

## 2023-12-10 DIAGNOSIS — E7800 Pure hypercholesterolemia, unspecified: Secondary | ICD-10-CM | POA: Diagnosis not present

## 2023-12-11 ENCOUNTER — Other Ambulatory Visit: Payer: Self-pay | Admitting: *Deleted

## 2023-12-11 DIAGNOSIS — M810 Age-related osteoporosis without current pathological fracture: Secondary | ICD-10-CM

## 2023-12-11 MED ORDER — DENOSUMAB 60 MG/ML ~~LOC~~ SOSY
60.0000 mg | PREFILLED_SYRINGE | SUBCUTANEOUS | Status: AC
  Administered 2023-12-18: 60 mg via SUBCUTANEOUS

## 2023-12-18 ENCOUNTER — Ambulatory Visit (INDEPENDENT_AMBULATORY_CARE_PROVIDER_SITE_OTHER)

## 2023-12-18 DIAGNOSIS — M810 Age-related osteoporosis without current pathological fracture: Secondary | ICD-10-CM | POA: Diagnosis not present

## 2023-12-30 ENCOUNTER — Encounter: Payer: Self-pay | Admitting: Physician Assistant

## 2023-12-31 ENCOUNTER — Encounter: Payer: Self-pay | Admitting: Physician Assistant

## 2024-01-04 ENCOUNTER — Encounter: Payer: Self-pay | Admitting: Physician Assistant

## 2024-01-04 DIAGNOSIS — Z1231 Encounter for screening mammogram for malignant neoplasm of breast: Secondary | ICD-10-CM | POA: Diagnosis not present

## 2024-01-05 ENCOUNTER — Encounter: Payer: Self-pay | Admitting: Physician Assistant

## 2024-01-05 DIAGNOSIS — H109 Unspecified conjunctivitis: Secondary | ICD-10-CM | POA: Diagnosis not present

## 2024-01-07 ENCOUNTER — Other Ambulatory Visit: Payer: Self-pay | Admitting: Physician Assistant

## 2024-01-07 DIAGNOSIS — R102 Pelvic and perineal pain: Secondary | ICD-10-CM

## 2024-01-08 ENCOUNTER — Ambulatory Visit
Admission: RE | Admit: 2024-01-08 | Discharge: 2024-01-08 | Disposition: A | Discharge status: Home / Self Care | Source: Ambulatory Visit | Attending: Physician Assistant | Admitting: Physician Assistant

## 2024-01-08 DIAGNOSIS — R102 Pelvic and perineal pain: Secondary | ICD-10-CM

## 2024-01-22 DIAGNOSIS — I10 Essential (primary) hypertension: Secondary | ICD-10-CM | POA: Diagnosis not present

## 2024-04-05 DIAGNOSIS — R300 Dysuria: Secondary | ICD-10-CM | POA: Diagnosis not present

## 2024-04-05 DIAGNOSIS — N958 Other specified menopausal and perimenopausal disorders: Secondary | ICD-10-CM | POA: Diagnosis not present

## 2024-04-07 DIAGNOSIS — I10 Essential (primary) hypertension: Secondary | ICD-10-CM | POA: Diagnosis not present

## 2024-04-07 DIAGNOSIS — G4709 Other insomnia: Secondary | ICD-10-CM | POA: Diagnosis not present

## 2024-04-07 DIAGNOSIS — R300 Dysuria: Secondary | ICD-10-CM | POA: Diagnosis not present

## 2024-04-07 DIAGNOSIS — E038 Other specified hypothyroidism: Secondary | ICD-10-CM | POA: Diagnosis not present

## 2024-04-07 DIAGNOSIS — E119 Type 2 diabetes mellitus without complications: Secondary | ICD-10-CM | POA: Diagnosis not present

## 2024-04-07 DIAGNOSIS — E559 Vitamin D deficiency, unspecified: Secondary | ICD-10-CM | POA: Diagnosis not present

## 2024-04-07 DIAGNOSIS — E871 Hypo-osmolality and hyponatremia: Secondary | ICD-10-CM | POA: Diagnosis not present

## 2024-04-07 DIAGNOSIS — E7800 Pure hypercholesterolemia, unspecified: Secondary | ICD-10-CM | POA: Diagnosis not present

## 2024-04-07 DIAGNOSIS — M818 Other osteoporosis without current pathological fracture: Secondary | ICD-10-CM | POA: Diagnosis not present

## 2024-04-13 DIAGNOSIS — E119 Type 2 diabetes mellitus without complications: Secondary | ICD-10-CM | POA: Diagnosis not present

## 2024-04-13 DIAGNOSIS — Z961 Presence of intraocular lens: Secondary | ICD-10-CM | POA: Diagnosis not present

## 2024-04-21 DIAGNOSIS — E871 Hypo-osmolality and hyponatremia: Secondary | ICD-10-CM | POA: Diagnosis not present

## 2024-04-21 DIAGNOSIS — G4709 Other insomnia: Secondary | ICD-10-CM | POA: Diagnosis not present

## 2024-04-21 DIAGNOSIS — E559 Vitamin D deficiency, unspecified: Secondary | ICD-10-CM | POA: Diagnosis not present

## 2024-04-21 DIAGNOSIS — I10 Essential (primary) hypertension: Secondary | ICD-10-CM | POA: Diagnosis not present

## 2024-04-21 DIAGNOSIS — M818 Other osteoporosis without current pathological fracture: Secondary | ICD-10-CM | POA: Diagnosis not present

## 2024-04-21 DIAGNOSIS — E038 Other specified hypothyroidism: Secondary | ICD-10-CM | POA: Diagnosis not present

## 2024-04-21 DIAGNOSIS — E119 Type 2 diabetes mellitus without complications: Secondary | ICD-10-CM | POA: Diagnosis not present

## 2024-04-21 DIAGNOSIS — E7800 Pure hypercholesterolemia, unspecified: Secondary | ICD-10-CM | POA: Diagnosis not present

## 2024-04-21 DIAGNOSIS — R771 Abnormality of globulin: Secondary | ICD-10-CM | POA: Diagnosis not present

## 2024-04-25 DIAGNOSIS — R300 Dysuria: Secondary | ICD-10-CM | POA: Diagnosis not present

## 2024-04-25 DIAGNOSIS — D414 Neoplasm of uncertain behavior of bladder: Secondary | ICD-10-CM | POA: Diagnosis not present

## 2024-04-25 DIAGNOSIS — N958 Other specified menopausal and perimenopausal disorders: Secondary | ICD-10-CM | POA: Diagnosis not present

## 2024-05-03 ENCOUNTER — Other Ambulatory Visit: Payer: Self-pay | Admitting: Urology

## 2024-05-04 ENCOUNTER — Other Ambulatory Visit: Payer: Self-pay | Admitting: Urology

## 2024-05-05 DIAGNOSIS — E119 Type 2 diabetes mellitus without complications: Secondary | ICD-10-CM | POA: Diagnosis not present

## 2024-05-05 DIAGNOSIS — E871 Hypo-osmolality and hyponatremia: Secondary | ICD-10-CM | POA: Diagnosis not present

## 2024-05-05 DIAGNOSIS — I10 Essential (primary) hypertension: Secondary | ICD-10-CM | POA: Diagnosis not present

## 2024-05-05 DIAGNOSIS — E038 Other specified hypothyroidism: Secondary | ICD-10-CM | POA: Diagnosis not present

## 2024-05-05 DIAGNOSIS — E7800 Pure hypercholesterolemia, unspecified: Secondary | ICD-10-CM | POA: Diagnosis not present

## 2024-05-05 DIAGNOSIS — E559 Vitamin D deficiency, unspecified: Secondary | ICD-10-CM | POA: Diagnosis not present

## 2024-05-05 DIAGNOSIS — M818 Other osteoporosis without current pathological fracture: Secondary | ICD-10-CM | POA: Diagnosis not present

## 2024-05-05 DIAGNOSIS — R771 Abnormality of globulin: Secondary | ICD-10-CM | POA: Diagnosis not present

## 2024-05-05 DIAGNOSIS — G4709 Other insomnia: Secondary | ICD-10-CM | POA: Diagnosis not present

## 2024-05-14 ENCOUNTER — Other Ambulatory Visit: Payer: Self-pay | Admitting: Radiology

## 2024-05-14 DIAGNOSIS — B009 Herpesviral infection, unspecified: Secondary | ICD-10-CM

## 2024-05-16 ENCOUNTER — Encounter (HOSPITAL_COMMUNITY): Payer: Self-pay | Admitting: Urology

## 2024-05-16 NOTE — Progress Notes (Signed)
 Spoke w/ via phone for pre-op interview--- [facility] needs dos----BMP, EKG, CBG and A1C per anesthesia.         Lab results------ COVID test -----patient states asymptomatic no test needed Arrive at -------1115 NPO after MN NO Solid Food.  Clear liquids from MN until---1015 Pre-Surgery Ensure or G2:  Med rec completed Medications to take morning of surgery -----Levothyroxine and Metoprolol Diabetic medication -----NONE AM of surgery  GLP1 agonist last dose: GLP1 instructions:  Patient instructed no nail polish to be worn day of surgery Patient instructed to bring photo id and insurance card day of surgery Patient aware to have Driver (ride ) / caregiver    for 24 hours after surgery - Son Dominique Gander Patient Special Instructions ----- Pre-Op special Instructions -----  Patient verbalized understanding of instructions that were given at this phone interview. Patient denies chest pain, sob, fever, cough at the interview.

## 2024-05-16 NOTE — Telephone Encounter (Signed)
 Med refill request: valACYclovir  (VALTREX ) 500 MG tablet  Start:  11/19/23 Disp:  90 tablets Refills:  1  Last B&P:  11/02/23 Next B&P:  11/02/24 Last MMG (if hormonal med):  N/A Refill authorized? Please Advise.

## 2024-05-21 NOTE — Progress Notes (Signed)
 COVID Vaccine received:  []  No [x]  Yes Date of any COVID positive Test in last 90 days:  none  PCP - Rosina Amy, PA Cardiologist - Gordy Bergamo, MD  has appt for Abn. EKG on 06-03-2023  Chest x-ray -  EKG - 04-08-2024 at PCP,  will request tracing  Stress Test -  ECHO - 01-18-2020  Epic Cardiac Cath -  CT Coronary Calcium score:   Pacemaker / ICD device [x]  No []  Yes   Spinal Cord Stimulator:[x]  No []  Yes       History of Sleep Apnea? [x]  No []  Yes   CPAP used?- [x]  No []  Yes    Medication on DOS: Levothyroxine, Metoprolol, eye drops   Hold DOS: Losartan   Patient has: []  NO Hx DM   [x]  Pre-DM   []  DM1  []   DM2 Does the patient monitor blood sugar?   [x]  N/A   []  No []  Yes  Last A1c was: 6.0 at PCP   Metformin Hold DOS   Blood Thinner / Instructions: none Aspirin Instructions:  ASA stopped already  Activity level: Able to walk up 2 flights of stairs without becoming significantly short of breath or having chest pain?   [x]    Yes   []  No,  would have:  Patient can perform ADLs without assistance.  [x]   Yes    []  No   Anesthesia review: HTN, Pre-DM, Raynaud's, anxiety  Patient denies any S&S of respiratory illness or Covid - no shortness of breath, fever, cough or chest pain at PAT appointment.  Patient verbalized understanding and agreement to the Pre-Surgical Instructions that were given to them at this PAT appointment. Patient was also educated of the need to review these PAT instructions again prior to her surgery.I reviewed the appropriate phone numbers to call if they have any and questions or concerns.

## 2024-05-23 ENCOUNTER — Encounter (HOSPITAL_COMMUNITY): Payer: Self-pay

## 2024-05-23 ENCOUNTER — Encounter (HOSPITAL_COMMUNITY)
Admission: RE | Admit: 2024-05-23 | Discharge: 2024-05-23 | Disposition: A | Discharge status: Home / Self Care | Source: Ambulatory Visit | Attending: Urology | Admitting: Urology

## 2024-05-23 ENCOUNTER — Other Ambulatory Visit: Payer: Self-pay

## 2024-05-23 VITALS — BP 152/74 | HR 58 | Temp 98.6°F | Resp 16 | Ht 60.0 in | Wt 115.0 lb

## 2024-05-23 DIAGNOSIS — E039 Hypothyroidism, unspecified: Secondary | ICD-10-CM | POA: Insufficient documentation

## 2024-05-23 DIAGNOSIS — I10 Essential (primary) hypertension: Secondary | ICD-10-CM | POA: Insufficient documentation

## 2024-05-23 DIAGNOSIS — Z79899 Other long term (current) drug therapy: Secondary | ICD-10-CM | POA: Diagnosis not present

## 2024-05-23 DIAGNOSIS — I7300 Raynaud's syndrome without gangrene: Secondary | ICD-10-CM | POA: Insufficient documentation

## 2024-05-23 DIAGNOSIS — D414 Neoplasm of uncertain behavior of bladder: Secondary | ICD-10-CM | POA: Diagnosis not present

## 2024-05-23 DIAGNOSIS — Z01812 Encounter for preprocedural laboratory examination: Secondary | ICD-10-CM | POA: Insufficient documentation

## 2024-05-23 DIAGNOSIS — Z8544 Personal history of malignant neoplasm of other female genital organs: Secondary | ICD-10-CM | POA: Diagnosis not present

## 2024-05-23 DIAGNOSIS — Z7982 Long term (current) use of aspirin: Secondary | ICD-10-CM | POA: Insufficient documentation

## 2024-05-23 DIAGNOSIS — Z01818 Encounter for other preprocedural examination: Secondary | ICD-10-CM

## 2024-05-23 HISTORY — DX: Raynaud's syndrome without gangrene: I73.00

## 2024-05-23 HISTORY — DX: Unspecified osteoarthritis, unspecified site: M19.90

## 2024-05-23 HISTORY — DX: Prediabetes: R73.03

## 2024-05-23 LAB — CBC
HCT: 44.4 % (ref 36.0–46.0)
Hemoglobin: 14.3 g/dL (ref 12.0–15.0)
MCH: 28.1 pg (ref 26.0–34.0)
MCHC: 32.2 g/dL (ref 30.0–36.0)
MCV: 87.2 fL (ref 80.0–100.0)
Platelets: 299 K/uL (ref 150–400)
RBC: 5.09 MIL/uL (ref 3.87–5.11)
RDW: 13.7 % (ref 11.5–15.5)
WBC: 5.9 K/uL (ref 4.0–10.5)
nRBC: 0 % (ref 0.0–0.2)

## 2024-05-23 NOTE — Anesthesia Preprocedure Evaluation (Addendum)
"                                    Anesthesia Evaluation  Patient identified by MRN, date of birth, ID band Patient awake    Reviewed: Allergy & Precautions, NPO status , Patient's Chart, lab work & pertinent test results  Airway Mallampati: II  TM Distance: >3 FB Neck ROM: Full    Dental no notable dental hx.    Pulmonary neg pulmonary ROS   Pulmonary exam normal        Cardiovascular hypertension, Pt. on home beta blockers and Pt. on medications Normal cardiovascular exam     Neuro/Psych   Anxiety     negative neurological ROS     GI/Hepatic negative GI ROS, Neg liver ROS,,,  Endo/Other  Hypothyroidism  Pre-DM  Renal/GU negative Renal ROS     Musculoskeletal negative musculoskeletal ROS (+)    Abdominal   Peds  Hematology negative hematology ROS (+)   Anesthesia Other Findings BLADDER TUMOR  Reproductive/Obstetrics                              Anesthesia Physical Anesthesia Plan  ASA: 3  Anesthesia Plan: General   Post-op Pain Management:    Induction: Intravenous  PONV Risk Score and Plan: 3 and Ondansetron , Dexamethasone  and Treatment may vary due to age or medical condition  Airway Management Planned: LMA  Additional Equipment:   Intra-op Plan:   Post-operative Plan: Extubation in OR  Informed Consent: I have reviewed the patients History and Physical, chart, labs and discussed the procedure including the risks, benefits and alternatives for the proposed anesthesia with the patient or authorized representative who has indicated his/her understanding and acceptance.     Dental advisory given  Plan Discussed with: CRNA  Anesthesia Plan Comments: (PAT note from 1/5)         Anesthesia Quick Evaluation  "

## 2024-05-23 NOTE — Progress Notes (Signed)
 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 BP mildly elevated and EKG was obtained which showed NSR and LAE (scanned in media on 1/5). She was referred to Cardiology and has upcoming appt on 06/03/23 with Dr. Ladona.  She's seen Dr.Ganji in the past due to mild-mod TR on echo in 2020. Last was in 2021. Last echo done in 2021 was normal. BP noted to be controlled. She was advised to f/u on as needed basis.  Discussed with Dr. Keneth regarding Cardiology referral. [name] to proceed.  VS: BP (!) 152/74 Comment: right arm sitting  Pulse (!) 58   Temp 37 C (Oral)   Resp 16   Ht 5' (1.524 m)   Wt 52.2 kg   SpO2 100%   BMI 22.46 kg/m   PROVIDERS: Rosalea Rosina SAILOR, PA   LABS: Labs reviewed: Acceptable for surgery. (all labs ordered are listed, but only abnormal results are displayed)  Labs Reviewed  COMPREHENSIVE METABOLIC PANEL WITH GFR  CBC     IMAGES:   EKG 04/07/24 (scanned in media on 1/5 from PCP office)   SR LAE   Echo 10/28/2019:  Findings: The LV is normal in its end diastolic dimension; wall thickness is normal; LVEF is normal; estimated at 60 to 65%.  There are no obvious WMA's seen.  The RV is normal in dimension, wall thickness, and contractions as seen.  Both atria are normal in dimension.  The IAS is not aneurysmal. The AV, MV, TV and PV are normal as seen The aortic root and upper portion of the IVC are normal in dimension.  There is no obvious  pericardial effusion The Doppler through the AV and LVOT shows normal PSV's with no AR seen.   The mitral inflow signal is normal for age and no HOCM MR seen.  There is no TR seen. The PV has normal PSV.  There is no obvious ASD    Past Medical History:  Diagnosis Date   Anxiety    Arthritis    Cancer (HCC) 2008   VULVAR CARCINOMA INSITU   Costochondritis    Diverticulosis    Gall stones    High cholesterol    HSV infection    Hypertension    Hypothyroidism    Osteopenia    Osteoporosis    Plantar fasciitis    Pre-diabetes    Raynaud's phenomenon    Shingles     Past Surgical History:  Procedure Laterality Date   ABDOMINAL HYSTERECTOMY     CATARACT EXTRACTION     CHOLECYSTECTOMY N/A 05/03/2014   Procedure: LAPAROSCOPIC CHOLECYSTECTOMY WITH INTRAOPERATIVE CHOLANGIOGRAM;  Surgeon: Deward Null III, MD;  Location: [facility] OR;  Service: General;  Laterality: N/A;   COLONOSCOPY  12/16/2012   HEMORRHOID SURGERY  2014    MEDICATIONS:  aspirin 81 MG tablet   cholecalciferol (VITAMIN D) 1000 UNITS tablet   Coenzyme Q10 (CO Q-10) 100 MG CAPS   denosumab  (PROLIA ) 60 MG/ML SOSY injection  fish oil-omega-3 fatty acids 1000 MG capsule   Lancets (ONETOUCH DELICA PLUS LANCET33G) MISC   levothyroxine (SYNTHROID) 25 MCG tablet   losartan (COZAAR) 100 MG tablet   Menaquinone-7 (VITAMIN K2 PO)   metFORMIN (GLUCOPHAGE) 500 MG tablet   metoprolol tartrate (LOPRESSOR) 25 MG tablet   Multiple Vitamin (MULTIVITAMIN) capsule   niacin (NIASPAN) 1000 MG CR tablet   ONETOUCH ULTRA test strip   Polyvinyl Alcohol-Povidone (REFRESH OP)   Probiotic Product ([employer] SENIOR PROBIOTIC) CAPS   thiothixene (NAVANE) 2 MG capsule   valACYclovir  (VALTREX ) 500 MG tablet   vitamin B-12 (CYANOCOBALAMIN ) 1000 MCG tablet   No current facility-administered medications for this encounter.   Burnard CHRISTELLA Odis DEVONNA [facility] Short Stay/Anesthesiology [facility] Phone [phone] 05/23/2024 11:40 AM        "

## 2024-05-23 NOTE — Patient Instructions (Addendum)
 SURGICAL WAITING ROOM VISITATION Patients having surgery or a procedure may have no more than 2 support people in the waiting area - these visitors may rotate in the visitor waiting room.   If the patient needs to stay at the hospital during part of their recovery, the visitor guidelines for inpatient rooms apply.  PRE-OP VISITATION  Pre-op nurse will coordinate an appropriate time for 1 support person to accompany the patient in pre-op.  This support person may not rotate.  This visitor will be contacted when the time is appropriate for the visitor to come back in the pre-op area.  To keep our patients, visitors and teammates safe and prevent the spread of respiratory illnesses over the next few months.  Temporary Visitor Restrictions  Children ages 75 and under will not be able to visit patients in [facility] under most circumstances. Visitation is not restricted outside of hospitals unless noted otherwise in the [facility] and Location Specific Visitation Guidelines at:       [email or web address]  Visitors with respiratory illnesses are discouraged from visiting and should remain at home. You are not required to quarantine at this time prior to your surgery. However, you must do this: Hand Hygiene often Do NOT share personal items Notify your provider if you are in close contact with someone who has COVID or you develop fever 100.4 or greater, new onset of sneezing, cough, sore throat, shortness of breath or body aches.  If you test positive for Covid or have been in contact with anyone that has tested positive in the last 10 days please notify you surgeon.    Your procedure is scheduled on:  Tuesday  May 24, 2024  Report to [facility] Main Entrance: [address] entrance where the [employer] is available.   Report to admitting at: 10:30    AM  Call this number if you have any questions or problems the morning of surgery [phone]  DO NOT EAT OR DRINK  ANYTHING AFTER MIDNIGHT THE NIGHT PRIOR TO YOUR SURGERY / PROCEDURE.   FOLLOW  ANY ADDITIONAL PRE OP INSTRUCTIONS YOU RECEIVED FROM YOUR SURGEON'S OFFICE!!!   Oral Hygiene is also important to reduce your risk of infection.        Remember - BRUSH YOUR TEETH THE MORNING OF SURGERY WITH YOUR REGULAR TOOTHPASTE  Do NOT smoke after Midnight the night before surgery.  STOP TAKING all Vitamins, Herbs and supplements 1 week before your surgery.   Take ONLY these medicines the morning of surgery with A SIP OF WATER :  Levothyroxine, Metoprolol, eye drops   DO NOT TAKE Losartan the day of surgery                   You may not have any metal on your body including hair pins, jewelry, and body piercing  Do not wear make-up, lotions, powders, perfumes or deodorant  Do not wear nail polish including gel and S&S, artificial / acrylic nails, or any other type of covering on natural nails including finger and toenails. If you have artificial nails, gel coating, etc., that needs to be removed by a nail salon, Please have this removed prior to surgery. Not doing so may mean that your surgery could be cancelled or delayed if the Surgeon or anesthesia staff feels like they are unable to monitor you safely.   Do not shave 48 hours prior to surgery to avoid nicks in your skin which may contribute to postoperative infections.   Contacts,  Hearing Aids, dentures or bridgework may not be worn into surgery. DENTURES WILL BE REMOVED PRIOR TO SURGERY PLEASE DO NOT APPLY Poly grip OR ADHESIVES!!!  Patients discharged on the day of surgery will not be allowed to drive home.  Someone NEEDS to stay with you for the first 24 hours after anesthesia.  Do not bring your home medications to the hospital. The Pharmacy will dispense medications listed on your medication list to you during your admission in the Hospital.  Please read over the following fact sheets you were given: IF YOU HAVE QUESTIONS ABOUT YOUR PRE-OP  INSTRUCTIONS, PLEASE CALL [phone].   [place] - Preparing for Surgery        Before surgery, you can play an important role.  Because skin is not sterile, your skin needs to be as free of germs as possible.  You can reduce the number of germs on your skin by washing with CHG (chlorahexidine gluconate) soap before surgery.  CHG is an antiseptic cleaner which kills germs and bonds with the skin to continue killing germs even after washing. Please DO NOT use if you have an allergy to CHG or antibacterial soaps.  If your skin becomes reddened/irritated stop using the CHG and inform your nurse when you arrive at Short Stay. Do not shave (including legs and underarms) for at least 48 hours prior to the first CHG shower.  You may shave your face/neck.  Please follow these instructions carefully:  1.  Shower with CHG Soap the night before surgery ONLY (DO NOT USE THE CHG SOAP THE MORNING OF SURGERY).  2.  If you choose to wash your hair, wash your hair first as usual with your normal  shampoo.  3.  After you shampoo, rinse your hair and body thoroughly to remove the shampoo.                             4.  Use CHG as you would any other liquid soap.  You can apply chg directly to the skin and wash.  Gently with a scrungie or clean washcloth.  5.  Apply the CHG Soap to your body ONLY FROM THE NECK DOWN.   Do not use on face/ open                           Wound or open sores. Avoid contact with eyes, ears mouth and genitals (private parts).                       Wash face,  Genitals (private parts) with your normal soap.             6.  Wash thoroughly, paying special attention to the area where your  surgery  will be performed.  7.  Thoroughly rinse your body with warm water  from the neck down.  8.  DO NOT shower/wash with your normal soap after using and rinsing off the CHG Soap.                9.  Pat yourself dry with a clean towel.            10.  Wear clean pajamas.            11.  Place  clean sheets on your bed the night of your first shower and do not  sleep with pets.  Day  of Surgery : Do not apply any CHG, lotions/deodorants the morning of surgery.  Please wear clean clothes to the hospital/surgery center.   FAILURE TO FOLLOW THESE INSTRUCTIONS MAY RESULT IN THE CANCELLATION OF YOUR SURGERY  PATIENT SIGNATURE_________________________________  NURSE SIGNATURE__________________________________  ________________________________________________________________________

## 2024-05-24 ENCOUNTER — Encounter (HOSPITAL_COMMUNITY): Payer: Self-pay | Admitting: Urology

## 2024-05-24 ENCOUNTER — Encounter (HOSPITAL_COMMUNITY): Admission: RE | Payer: Self-pay | Source: Home / Self Care

## 2024-05-24 ENCOUNTER — Ambulatory Visit (HOSPITAL_COMMUNITY): Admitting: Anesthesiology

## 2024-05-24 ENCOUNTER — Ambulatory Visit (HOSPITAL_COMMUNITY)
Admission: RE | Admit: 2024-05-24 | Discharge: 2024-05-24 | Disposition: A | Discharge status: Home / Self Care | Attending: Urology | Admitting: Urology

## 2024-05-24 ENCOUNTER — Ambulatory Visit (HOSPITAL_COMMUNITY): Admitting: Medical

## 2024-05-24 DIAGNOSIS — E785 Hyperlipidemia, unspecified: Secondary | ICD-10-CM | POA: Insufficient documentation

## 2024-05-24 DIAGNOSIS — R300 Dysuria: Secondary | ICD-10-CM | POA: Diagnosis not present

## 2024-05-24 DIAGNOSIS — I10 Essential (primary) hypertension: Secondary | ICD-10-CM | POA: Diagnosis not present

## 2024-05-24 DIAGNOSIS — M810 Age-related osteoporosis without current pathological fracture: Secondary | ICD-10-CM | POA: Diagnosis not present

## 2024-05-24 DIAGNOSIS — N3020 Other chronic cystitis without hematuria: Secondary | ICD-10-CM | POA: Insufficient documentation

## 2024-05-24 DIAGNOSIS — D414 Neoplasm of uncertain behavior of bladder: Secondary | ICD-10-CM | POA: Diagnosis present

## 2024-05-24 DIAGNOSIS — I7300 Raynaud's syndrome without gangrene: Secondary | ICD-10-CM | POA: Diagnosis not present

## 2024-05-24 DIAGNOSIS — R000 Tachycardia, unspecified: Secondary | ICD-10-CM | POA: Insufficient documentation

## 2024-05-24 DIAGNOSIS — K5900 Constipation, unspecified: Secondary | ICD-10-CM | POA: Diagnosis not present

## 2024-05-24 DIAGNOSIS — F419 Anxiety disorder, unspecified: Secondary | ICD-10-CM

## 2024-05-24 DIAGNOSIS — E039 Hypothyroidism, unspecified: Secondary | ICD-10-CM | POA: Diagnosis not present

## 2024-05-24 DIAGNOSIS — E119 Type 2 diabetes mellitus without complications: Secondary | ICD-10-CM

## 2024-05-24 HISTORY — PX: TRANSURETHRAL RESECTION OF BLADDER TUMOR: SHX2575

## 2024-05-24 LAB — GLUCOSE, CAPILLARY
Glucose-Capillary: 82 mg/dL (ref 70–99)
Glucose-Capillary: 148 mg/dL — ABNORMAL HIGH (ref 70–99)

## 2024-05-24 SURGERY — TURBT (TRANSURETHRAL RESECTION OF BLADDER TUMOR)
Anesthesia: General

## 2024-05-24 MED ORDER — CIPROFLOXACIN IN D5W 400 MG/200ML IV SOLN
400.0000 mg | Freq: Two times a day (BID) | INTRAVENOUS | Status: DC
  Administered 2024-05-24: 400 mg via INTRAVENOUS
  Filled 2024-05-24: qty 200

## 2024-05-24 MED ORDER — FENTANYL CITRATE (PF) 100 MCG/2ML IJ SOLN
INTRAMUSCULAR | Status: AC
  Filled 2024-05-24: qty 2

## 2024-05-24 MED ORDER — SUGAMMADEX SODIUM 200 MG/2ML IV SOLN
INTRAVENOUS | Status: DC | PRN
  Administered 2024-05-24: 200 mg via INTRAVENOUS

## 2024-05-24 MED ORDER — PROPOFOL 10 MG/ML IV BOLUS
INTRAVENOUS | Status: AC
  Filled 2024-05-24: qty 20

## 2024-05-24 MED ORDER — LIDOCAINE HCL (CARDIAC) PF 100 MG/5ML IV SOSY
PREFILLED_SYRINGE | INTRAVENOUS | Status: DC | PRN
  Administered 2024-05-24: 60 mg via INTRAVENOUS

## 2024-05-24 MED ORDER — ESMOLOL HCL 100 MG/10ML IV SOLN
INTRAVENOUS | Status: DC | PRN
  Administered 2024-05-24: 20 mg via INTRAVENOUS
  Administered 2024-05-24 (×2): 30 mg via INTRAVENOUS

## 2024-05-24 MED ORDER — ROCURONIUM BROMIDE 10 MG/ML (PF) SYRINGE
PREFILLED_SYRINGE | INTRAVENOUS | Status: DC | PRN
  Administered 2024-05-24: 30 mg via INTRAVENOUS

## 2024-05-24 MED ORDER — PROPOFOL 10 MG/ML IV BOLUS
INTRAVENOUS | Status: DC | PRN
  Administered 2024-05-24: 100 mg via INTRAVENOUS
  Administered 2024-05-24: 80 ug/kg/min via INTRAVENOUS
  Administered 2024-05-24: 100 mg via INTRAVENOUS

## 2024-05-24 MED ORDER — LACTATED RINGERS IV SOLN: INTRAVENOUS | Status: DC

## 2024-05-24 MED ORDER — FENTANYL CITRATE (PF) 100 MCG/2ML IJ SOLN
INTRAMUSCULAR | Status: DC | PRN
  Administered 2024-05-24 (×2): 50 ug via INTRAVENOUS

## 2024-05-24 MED ORDER — PROPOFOL 500 MG/50ML IV EMUL
INTRAVENOUS | Status: AC
  Filled 2024-05-24: qty 50

## 2024-05-24 MED ORDER — ACETAMINOPHEN 500 MG PO TABS
1000.0000 mg | ORAL_TABLET | Freq: Once | ORAL | Status: AC
  Administered 2024-05-24: 1000 mg via ORAL
  Filled 2024-05-24: qty 2

## 2024-05-24 MED ORDER — 0.9 % SODIUM CHLORIDE (POUR BTL) OPTIME
TOPICAL | Status: DC | PRN
  Administered 2024-05-24: 1000 mL

## 2024-05-24 MED ORDER — SUGAMMADEX SODIUM 200 MG/2ML IV SOLN
INTRAVENOUS | Status: AC
  Filled 2024-05-24: qty 2

## 2024-05-24 MED ORDER — ONDANSETRON HCL 4 MG/2ML IJ SOLN
INTRAMUSCULAR | Status: DC | PRN
  Administered 2024-05-24: 4 mg via INTRAVENOUS

## 2024-05-24 MED ORDER — STERILE WATER FOR IRRIGATION IR SOLN
Status: DC | PRN
  Administered 2024-05-24: 3000 mL

## 2024-05-24 MED ORDER — ROCURONIUM BROMIDE 10 MG/ML (PF) SYRINGE
PREFILLED_SYRINGE | INTRAVENOUS | Status: AC
  Filled 2024-05-24: qty 10

## 2024-05-24 MED ORDER — CHLORHEXIDINE GLUCONATE 0.12 % MT SOLN
15.0000 mL | Freq: Once | OROMUCOSAL | Status: AC
  Administered 2024-05-24: 15 mL via OROMUCOSAL

## 2024-05-24 MED ORDER — AMISULPRIDE (ANTIEMETIC) 5 MG/2ML IV SOLN: 10.0000 mg | Freq: Once | INTRAVENOUS | Status: DC | PRN

## 2024-05-24 MED ORDER — ONDANSETRON HCL 4 MG/2ML IJ SOLN: 4.0000 mg | Freq: Once | INTRAMUSCULAR | Status: DC | PRN

## 2024-05-24 MED ORDER — HYOSCYAMINE SULFATE 0.125 MG PO TBDP: 0.1250 mg | ORAL_TABLET | Freq: Four times a day (QID) | ORAL | 0 refills | Status: AC | PRN

## 2024-05-24 MED ORDER — DEXAMETHASONE SODIUM PHOSPHATE 4 MG/ML IJ SOLN
INTRAMUSCULAR | Status: DC | PRN
  Administered 2024-05-24: 4 mg via INTRAVENOUS

## 2024-05-24 MED ORDER — ORAL CARE MOUTH RINSE: 15.0000 mL | Freq: Once | OROMUCOSAL | Status: AC

## 2024-05-24 MED ORDER — FENTANYL CITRATE (PF) 50 MCG/ML IJ SOSY: 25.0000 ug | PREFILLED_SYRINGE | INTRAMUSCULAR | Status: DC | PRN

## 2024-05-24 SURGICAL SUPPLY — 15 items
BAG URINE DRAIN 2000ML AR STRL (UROLOGICAL SUPPLIES) IMPLANT
BAG URO CATCHER STRL LF (MISCELLANEOUS) ×1 IMPLANT
CLOTH BEACON ORANGE TIMEOUT ST (SAFETY) ×1 IMPLANT
DRAPE FOOT SWITCH (DRAPES) ×1 IMPLANT
ELECT REM PT RETURN 15FT ADLT (MISCELLANEOUS) IMPLANT
GLOVE BIO SURGEON STRL SZ 6.5 (GLOVE) ×1 IMPLANT
GOWN STRL REUS W/ TWL LRG LVL3 (GOWN DISPOSABLE) ×1 IMPLANT
KIT TURNOVER KIT A (KITS) ×1 IMPLANT
LOOP CUT BIPOLAR 24F LRG (ELECTROSURGICAL) IMPLANT
MANIFOLD NEPTUNE II (INSTRUMENTS) ×1 IMPLANT
PACK CYSTO (CUSTOM PROCEDURE TRAY) ×1 IMPLANT
PAD PREP 24X48 CUFFED NSTRL (MISCELLANEOUS) ×1 IMPLANT
SYRINGE TOOMEY IRRIG 70ML (MISCELLANEOUS) IMPLANT
TUBING CONNECTING 10 (TUBING) ×1 IMPLANT
TUBING UROLOGY SET (TUBING) ×1 IMPLANT

## 2024-05-24 NOTE — Transfer of Care (Signed)
 Immediate Anesthesia Transfer of Care Note  Patient: Linda Barber  Procedure(s) Performed: TURBT (TRANSURETHRAL RESECTION OF BLADDER TUMOR)  Patient Location: PACU  Anesthesia Type:General  Level of Consciousness: sedated, drowsy, patient cooperative, and responds to stimulation  Airway & Oxygen Therapy: Patient Spontanous Breathing and Patient connected to face mask oxygen  Post-op Assessment: Report given to RN and Post -op Vital signs reviewed and stable  Post vital signs: Reviewed and stable  Last Vitals:  Vitals Value Taken Time  BP 150/90 05/24/24 17:01  Temp    Pulse 79 05/24/24 17:06  Resp 12 05/24/24 17:06  SpO2 100 % 05/24/24 17:06  Vitals shown include unfiled device data.  Last Pain:  Vitals:   05/24/24 1359  TempSrc: Oral  PainSc: 0-No pain         Complications: No notable events documented.

## 2024-05-24 NOTE — Anesthesia Procedure Notes (Addendum)
 Procedure Name: Intubation Date/Time: 05/24/2024 4:16 PM  Performed by: Nada Corean CROME, CRNAPre-anesthesia Checklist: Patient identified, Emergency Drugs available, Suction available, Patient being monitored and Timeout performed Patient Re-evaluated:Patient Re-evaluated prior to induction Oxygen Delivery Method: Circle system utilized Preoxygenation: Pre-oxygenation with 100% oxygen Induction Type: IV induction Ventilation: Mask ventilation without difficulty LMA Size: 4.0 Laryngoscope Size: Mac and 3 Grade View: Grade IV Tube type: Oral Tube size: 7.0 mm Number of attempts: 2 Airway Equipment and Method: Stylet Placement Confirmation: positive ETCO2, breath sounds checked- equal and bilateral and ETT inserted through vocal cords under direct vision Secured at: 21 cm Tube secured with: Tape Dental Injury: Teeth and Oropharynx as per pre-operative assessment  Difficulty Due To: Difficulty was unanticipated and Difficult Airway- due to anterior larynx Comments: First attempts with curved and straight LMA with inadequate Vt; converted to GETA

## 2024-05-24 NOTE — Discharge Instructions (Addendum)
 Post Bladder Surgery Instructions   General instructions:     Your recent bladder surgery requires very little post hospital care but some definite precautions.  Despite the fact that no skin incisions were used, the area around the bladder incisions are raw and covered with scabs to promote healing and prevent bleeding. Certain precautions are needed to insure that the scabs are not disturbed over the next 2-4 weeks while the healing proceeds.  Because the raw surface inside your bladder and the irritating effects of urine you may expect frequency of urination and/or urgency (a stronger desire to urinate) and perhaps even getting up at night more often. This will usually resolve or improve slowly over the healing period. You may see some blood in your urine over the first 6 weeks. Do not be alarmed, even if the urine was clear for a while. Get off your feet and drink lots of fluids until clearing occurs. If you start to pass clots or don't improve call us .  Catheter: (If you are discharged with a catheter.)  1. Keep your catheter secured to your leg at all times with tape or the supplied strap. 2. You may experience leakage of urine around your catheter- as long as the  catheter continues to drain, this is normal.  If your catheter stops draining  go to the ER. 3. You may also have blood in your urine, even after it has been clear for  several days; you may even pass some small blood clots or other material.  This  is normal as well.  If this happens, sit down and drink plenty of water  to help  make urine to flush out your bladder.  If the blood in your urine becomes worse  after doing this, contact our office or return to the ER. 4. You may use the leg bag (small bag) during the day, but use the large bag at  night.  Diet:  You may return to your normal diet immediately. Because of the raw surface of your bladder, alcohol, spicy foods, foods high in acid and drinks with caffeine may  cause irritation or frequency and should be used in moderation. To keep your urine flowing freely and avoid constipation, drink plenty of fluids during the day (8-10 glasses). Tip: Avoid cranberry juice because it is very acidic.  Activity:  Your physical activity doesn't need to be restricted. However, if you are very active, you may see some blood in the urine. We suggest that you reduce your activity under the circumstances until the bleeding has stopped.  Bowels:  It is important to keep your bowels regular during the postoperative period. Straining with bowel movements can cause bleeding. A bowel movement every other day is reasonable. Use a mild laxative if needed, such as milk of magnesia 2-3 tablespoons, or 2 Dulcolax tablets. Call if you continue to have problems. If you had been taking narcotics for pain, before, during or after your surgery, you may be constipated. Take a laxative if necessary.    Medication:  You should resume your pre-surgery medications unless told not to. In addition you may be given an antibiotic to prevent or treat infection. Antibiotics are not always necessary. All medication should be taken as prescribed until the bottles are finished unless you are having an unusual reaction to one of the drugs.   AZO - can be take to help with burning with urination. This is found at the pharmacy and is over the counter. Hyoscyamine  can be  taken up to three times a day to help with bladder cramping

## 2024-05-24 NOTE — Brief Op Note (Deleted)
 Operative Note  Preoperative diagnosis:  1.  Abnormal bladder mucosa  Postoperative diagnosis: 1.  Abnormal bladder mucosa  Procedure(s): 1.  Cystoscopy, bladder biopsy, fulguration  Surgeon: Valli Shank, MD  Assistants:  None  Anesthesia:  General  Complications:  None  EBL: Minimal  Specimens: 1.  Posterior wall bladder biopsy  Drains/Catheters: 1.  None  Intraoperative findings:   Normal urethra Bilateral orthotopic ureteral orifices Previously seen area of abnormal mucosa again visualized in the posterior superior bladder wall/dome 3 cm  Indication:  Linda Barber is a 84 y.o. female with abnormal bladder mucosa.  Description of procedure:  After risks and benefits of procedure discussed with the patient, informed consent was obtained.  The patient was taken to the operating placed in supine position.  Anesthesia induced and antibiotics were administered.  She was then repositioned in dorsolithotomy position.  A 21 French rigid cystoscope was placed in the urethral meatus and advanced into the bladder under direct visualization.  The previously identified area of abnormal bladder mucosa was again visualized in the posterior superior bladder wall.  Biopsy forceps were used to take a bladder biopsy from the posterior superior bladder wall.  The Bugbee was then used to fulgurate this area.  It was contiguous with the second area previously seen at the dome of the bladder.  Hemostasis was adequate with the irrigant turned off.  The patient's bladder was decompressed and the cystoscope was removed.  The patient emerged from anesthesia and was transferred to the PACU in stable condition.  Plan:  discharge home today

## 2024-05-24 NOTE — Op Note (Signed)
 Operative Note  Preoperative diagnosis:  1.  Abnormal bladder mucosa  Postoperative diagnosis: 1.  Abnormal bladder mucosa  Procedure(s): 1.  Cystoscopy, bladder biopsy, fulguration  Surgeon: Valli Shank, MD  Assistants:  None  Anesthesia:  General  Complications:  None  EBL: Minimal  Specimens: 1.  Posterior wall bladder biopsy  Drains/Catheters: 1.  None  Intraoperative findings:   Normal urethra Bilateral orthotopic ureteral orifices Previously seen area of abnormal mucosa again visualized in the posterior superior bladder wall/dome 3 cm  Indication:  Linda Barber is a 84 y.o. female with abnormal bladder mucosa.  Description of procedure:  After risks and benefits of procedure discussed with the patient, informed consent was obtained.  The patient was taken to the operating placed in supine position.  Anesthesia induced and antibiotics were administered.  She was then repositioned in dorsolithotomy position.  A 21 French rigid cystoscope was placed in the urethral meatus and advanced into the bladder under direct visualization.  The previously identified area of abnormal bladder mucosa was again visualized in the posterior superior bladder wall.  Biopsy forceps were used to take a bladder biopsy from the posterior superior bladder wall.  The Bugbee was then used to fulgurate this area.  It was contiguous with the second area previously seen at the dome of the bladder.  Hemostasis was adequate with the irrigant turned off.  The patient's bladder was decompressed and the cystoscope was removed.  The patient emerged from anesthesia and was transferred to the PACU in stable condition.  Plan:  discharge home today

## 2024-05-24 NOTE — H&P (Signed)
 "   History of present illness: 84 yo woman with dysuria found to have abnormal bladder mucosa on diagnostic cystoscopy. She presents for outpatient bladder biopsy/fulguration.   Review of systems: A 12 point comprehensive review of systems was obtained and is negative unless otherwise stated in the history of present illness.  Patient Active Problem List   Diagnosis Date Noted   Constipation 07/10/2022   Flatulence 07/10/2022   Raynaud's phenomenon 09/10/2018   Elevated Lp(a) 09/10/2018   Essential hypertension 07/01/2018   Hyperlipemia 07/01/2018   Tachycardia 07/01/2018   Osteoporosis 06/19/2015   VIN III (vulvar intraepithelial neoplasia III) 04/05/2013   Vaginal atrophy 04/05/2013    Medications Ordered Prior to Encounter[1]  Past Medical History:  Diagnosis Date   Anxiety    Arthritis    Cancer (HCC) 2008   VULVAR CARCINOMA INSITU   Costochondritis    Diverticulosis    Gall stones    High cholesterol    HSV infection    Hypertension    Hypothyroidism    Osteopenia    Osteoporosis    Plantar fasciitis    Pre-diabetes    Raynaud's phenomenon    Shingles     Past Surgical History:  Procedure Laterality Date   ABDOMINAL HYSTERECTOMY     CATARACT EXTRACTION     CHOLECYSTECTOMY N/A 05/03/2014   Procedure: LAPAROSCOPIC CHOLECYSTECTOMY WITH INTRAOPERATIVE CHOLANGIOGRAM;  Surgeon: Deward Null III, MD;  Location: [facility] OR;  Service: General;  Laterality: N/A;   COLONOSCOPY  12/16/2012   HEMORRHOID SURGERY  2014    Social History[2]  Family History  Problem Relation Age of Onset   Hypertension Mother    Hypertension Sister    Hypertension Brother    Heart attack Brother    Diabetes Cousin     PE: Vitals:   05/16/24 1209 05/24/24 1359  BP:  (!) 145/78  Pulse:  77  Resp:  18  Temp:  97.7 F (36.5 C)  TempSrc:  Oral  SpO2:  99%  Weight: 52.2 kg 52.2 kg  Height: 5' (1.524 m) 5' (1.524 m)   Patient appears to be in no acute distress  patient is alert  and oriented x3 Atraumatic normocephalic head Grossly neurologically intact No identifiable skin lesions  Recent Labs    05/23/24 0906  WBC 5.9  HGB 14.3  HCT 44.4   No results for input(s): NA, K, CL, CO2, GLUCOSE, BUN, CREATININE, CALCIUM in the last 72 hours. No results for input(s): LABPT, INR in the last 72 hours. No results for input(s): LABURIN in the last 72 hours. Results for orders placed or performed in visit on 12/08/19  Urine Culture     Status: None   Collection Time: 12/08/19  3:13 PM  Result Value Ref Range Status   MICRO NUMBER: [id number]  Final   SPECIMEN QUALITY: Adequate  Final   Sample Source URINE  Final   STATUS: FINAL  Final   Result: No Growth  Final  WET PREP FOR TRICH, YEAST, CLUE     Status: None   Collection Time: 12/08/19  3:30 PM   Specimen: Genital  Result Value Ref Range Status   Source: VAGINA  Final   RESULT   Final    Comment: EPITHELIAL CELLS-PRESENT CLUE CELLS-NONE SEEN YEAST-NONE SEEN TRICHOMONAS-NONE SEEN WBC-FEW BACTERIA-MODERATE EPITHELIAL CELLS- <6    A/P:  Abnormal bladder mucosa -please refer to office note from 12/8 -risks and benefits discussed and informed consent obtained -cystoscopy with bladder biopsy and fulguration  scheduled for today  Shawniece Oyola D Madelynne Lasker        [1]  No current facility-administered medications on file prior to encounter.   Current Outpatient Medications on File Prior to Encounter  Medication Sig Dispense Refill   aspirin 81 MG tablet Take 81 mg by mouth daily.     cholecalciferol (VITAMIN D) 1000 UNITS tablet Take 1,000 Units by mouth daily.     Coenzyme Q10 (CO Q-10) 100 MG CAPS 1 cap(s)     fish oil-omega-3 fatty acids 1000 MG capsule Take 1 g by mouth 2 (two) times a day.      levothyroxine (SYNTHROID) 25 MCG tablet 1 tab(s)     losartan (COZAAR) 100 MG tablet 1 tab(s)     Menaquinone-7 (VITAMIN K2 PO) Take by mouth.     metFORMIN (GLUCOPHAGE) 500 MG tablet Take  by mouth.     metoprolol tartrate (LOPRESSOR) 25 MG tablet Take 25 mg by mouth 2 (two) times daily.     Multiple Vitamin (MULTIVITAMIN) capsule Take 1 capsule by mouth daily.     niacin (NIASPAN) 1000 MG CR tablet Take 1,000 mg by mouth daily.  3   ONETOUCH ULTRA test strip      Polyvinyl Alcohol-Povidone (REFRESH OP) Apply to eye.     Probiotic Product ([employer] SENIOR PROBIOTIC) CAPS Take by mouth daily.     vitamin B-12 (CYANOCOBALAMIN ) 1000 MCG tablet 1 tab(s)     denosumab  (PROLIA ) 60 MG/ML SOSY injection Inject 60 mg into the skin every 6 (six) months.     Lancets (ONETOUCH DELICA PLUS LANCET33G) MISC Apply topically 3 (three) times daily.     thiothixene (NAVANE) 2 MG capsule Take 2 mg by mouth. TWICE A WEEK    [2]  Social History Tobacco Use   Smoking status: Never   Smokeless tobacco: Never  Vaping Use   Vaping status: Never Used  Substance Use Topics   Alcohol use: No    Alcohol/week: 0.0 standard drinks of alcohol   Drug use: No   "

## 2024-05-25 ENCOUNTER — Encounter (HOSPITAL_COMMUNITY): Payer: Self-pay | Admitting: Urology

## 2024-05-25 NOTE — Anesthesia Postprocedure Evaluation (Signed)
"   Anesthesia Post Note  Patient: Linda Barber  Procedure(s) Performed: TURBT (TRANSURETHRAL RESECTION OF BLADDER TUMOR)     Patient location during evaluation: PACU Anesthesia Type: General Level of consciousness: awake Pain management: pain level controlled Vital Signs Assessment: post-procedure vital signs reviewed and stable Respiratory status: spontaneous breathing, nonlabored ventilation and respiratory function stable Cardiovascular status: blood pressure returned to baseline and stable Postop Assessment: no apparent nausea or vomiting Anesthetic complications: no   No notable events documented.  Last Vitals:  Vitals:   05/24/24 1800 05/24/24 1809  BP: (!) 152/102 (!) 161/76  Pulse: (!) 51 69  Resp:  14  Temp:    SpO2: (!) 80% 97%    Last Pain:  Vitals:   05/24/24 1809  TempSrc:   PainSc: 0-No pain                 Karina Nofsinger P Althea Backs      "

## 2024-05-26 LAB — SURGICAL PATHOLOGY

## 2024-06-01 NOTE — Progress Notes (Signed)
 " Cardiology Office Note:  .   Date:  06/02/2024  ID:  Linda Barber, DOB [birth year], MRN [id number] PCP: Rosalea Rosina SAILOR, PA  [facility] Providers Cardiologist:  None   History of Present Illness: .   Linda Barber is a 84 y.o. African Amercian female with chronic diastolic heart failure, hypertension, prediabetes mellitus, hyper-lipidemia, mildly elevated LPA at 91 nmol/L,  Raynaud's disease last seen by me in 2021 presents to reestablish care in view of underlying cardiovascular risk factors.  She was told to have an abnormal EKG and patient got concerned and wanted to follow-up with me.  She did asymptomatic and continues to remain active.    Discussed the use of AI scribe software for clinical note transcription with the patient, who gave verbal consent to proceed.  History of Present Illness Linda Barber is an 84 year old female who presents with concerns about her EKG and recent bladder surgery.  She is concerned about an abnormal EKG. She had chest burning several years ago with normal evaluation including an echocardiogram in 2021, neck artery imaging in 2020, and a circulation test in 2023. She currently has no chest pain, shortness of breath, or palpitations.  She recently had a benign bladder tumor removed last week. She has some abdominal discomfort that she attributes to gas but no cardiac-type symptoms.  She is active on a treadmill. She takes Lopressor 25 mg twice daily and Losartan 100 mg daily for blood pressure, Niacin for cholesterol, and Metformin for blood sugar. Her last hemoglobin A1c was 5.7%.  Cardiac Studies relevent.   Carotid artery duplex  07/05/2018:  Minimal soft plaque bilateral carotid arteries. Antegrade right vertebral artery flow. Antegrade left vertebral artery flow.   Echocardiogram 10/28/2019:  Normal LV systolic function, EF 60 to 65%.  Essentially normal echocardiogram without significant valvular abnormality.  ABI 10/07/2021:    EKG:   EKG Interpretation Date/Time:  Thursday June 02 2024 09:40:31 EST Ventricular Rate:  59 PR Interval:    QRS Duration:  82 QT Interval:  388 QTC Calculation: 384 R Axis:   -15  Text Interpretation: EKG 06/02/2024: Sinus rhythm with short PR interval otherwise normal EKG.  Compared to 04/26/2014, no change. Confirmed by Dawana Asper, Jagadeesh [phone]) on 06/02/2024 9:46:47 AM  Labs  Care everywhere/Faxed External Labs:  Labs 04/08/2024:  TSH normal at 2.38.  A1c 5.7%.  Hb 14.3/HCT 45.5, platelets 319.  BUN 13, creatinine 0.72, GFR 83 mL, potassium 4.3, LFTs normal.  Total cholesterol 292, triglycerides 66, HDL 92, LDL 95.  Non-HDL cholesterol 110.  ROS  Review of Systems  Cardiovascular:  Negative for chest pain, dyspnea on exertion and leg swelling.   Physical Exam:   VS:  BP 122/82 (BP Location: Left Arm, Patient Position: Sitting, Cuff Size: Normal)   Pulse 70   Ht 5' (1.524 m)   Wt 118 lb (53.5 kg)   SpO2 99%   BMI 23.05 kg/m    Wt Readings from Last 3 Encounters:  06/02/24 118 lb (53.5 kg)  05/24/24 115 lb (52.2 kg)  05/23/24 115 lb (52.2 kg)    BP Readings from Last 3 Encounters:  06/02/24 122/82  05/24/24 (!) 161/76  05/23/24 (!) 152/74   Physical Exam Neck:     Vascular: No carotid bruit or JVD.  Cardiovascular:     Rate and Rhythm: Normal rate and regular rhythm.     Pulses: Intact distal pulses.     Heart sounds: Normal heart sounds.  No murmur heard.    No gallop.  Pulmonary:     Effort: Pulmonary effort is normal.     Breath sounds: Normal breath sounds.  Abdominal:     General: Bowel sounds are normal.     Palpations: Abdomen is soft.  Musculoskeletal:     Right lower leg: No edema.     Left lower leg: No edema.     ASSESSMENT AND PLAN: .      ICD-10-CM   1. Shortened PR interval  R94.31 EKG 12-Lead    2. Primary hypertension  I10 EKG 12-Lead    3. Mixed hyperlipidemia  E78.2     4. Hyperglycemia  R73.9      Assessment &  Plan Evaluation of abnormal electrocardiogram (short PR interval) EKG shows a short PR interval, consistent with previous EKGs from 2015. No clinical significance as it is normal for her. No symptoms of chest discomfort or other cardiac issues. Previous extensive cardiac workup, including echocardiogram and circulation tests, were normal. - Continue current management as EKG is normal for her.  Primary hypertension Blood pressure is well controlled with current medication regimen. On Lopressor 25 mg twice a day and losartan 100 mg once a day. - Continue Lopressor 25 mg twice a day. - Continue losartan 100 mg once a day.  Mixed hyperlipidemia Cholesterol levels are well controlled with niacin. HDL is 92 and LDL is 95. No changes needed as she is asymptomatic. - Continue niacin as prescribed.  Prediabetes Hemoglobin A1c is 5.7%, indicating well-controlled blood sugar levels. On metformin for blood sugar management. No significant concerns at this time. - Continue metformin as prescribed.  Follow up: PRN  Signed,  Gordy Bergamo, MD, [facility] 06/02/2024, 10:04 AM [facility] [address] [place], [place] [address] Phone: [phone]. Fax:  [phone]  "

## 2024-06-02 ENCOUNTER — Encounter: Payer: Self-pay | Admitting: Cardiology

## 2024-06-02 ENCOUNTER — Ambulatory Visit: Admitting: Cardiology

## 2024-06-02 VITALS — BP 122/82 | HR 70 | Ht 60.0 in | Wt 118.0 lb

## 2024-06-02 DIAGNOSIS — R9431 Abnormal electrocardiogram [ECG] [EKG]: Secondary | ICD-10-CM | POA: Diagnosis not present

## 2024-06-02 DIAGNOSIS — E782 Mixed hyperlipidemia: Secondary | ICD-10-CM

## 2024-06-02 DIAGNOSIS — R739 Hyperglycemia, unspecified: Secondary | ICD-10-CM | POA: Diagnosis not present

## 2024-06-02 DIAGNOSIS — I10 Essential (primary) hypertension: Secondary | ICD-10-CM

## 2024-06-02 NOTE — Patient Instructions (Addendum)
 Medication Instructions:  Your physician recommends that you continue on your current medications as directed. Please refer to the Current Medication list given to you today.  *If you need a refill on your cardiac medications before your next appointment, please call your pharmacy*   Follow-Up: At [facility], you and your health needs are our priority.  As part of our continuing mission to provide you with exceptional heart care, our providers are all part of one team.  This team includes your primary Cardiologist (physician) and Advanced Practice Providers or APPs (Physician Assistants and Nurse Practitioners) who all work together to provide you with the care you need, when you need it.  Your next appointment:    As Needed  Provider:   Gordy Bergamo, MD  We recommend signing up for the patient portal called MyChart.  Sign up information is provided on this After Visit Summary.  MyChart is used to connect with patients for Virtual Visits (Telemedicine).  Patients are able to view lab/test results, encounter notes, upcoming appointments, etc.  Non-urgent messages can be sent to your provider as well.   To learn more about what you can do with MyChart, go to [email or web address].   Other Instructions        We recommend signing up for the patient portal called MyChart.  Patients are able to view lab/test results, encounter notes, upcoming appointments, etc.  Non-urgent messages can be sent to your provider as well, go to [email or web address].

## 2024-06-07 ENCOUNTER — Other Ambulatory Visit: Payer: Self-pay | Admitting: *Deleted

## 2024-06-07 DIAGNOSIS — M810 Age-related osteoporosis without current pathological fracture: Secondary | ICD-10-CM

## 2024-06-07 MED ORDER — DENOSUMAB 60 MG/ML ~~LOC~~ SOSY: 60.0000 mg | PREFILLED_SYRINGE | SUBCUTANEOUS | Status: AC

## 2024-06-13 ENCOUNTER — Telehealth: Payer: Self-pay

## 2024-06-13 ENCOUNTER — Other Ambulatory Visit (HOSPITAL_COMMUNITY): Payer: Self-pay

## 2024-06-13 NOTE — Telephone Encounter (Signed)
 MEDICAL PA SUBMITTED VIA [employer]. Authorization Number : [id number]    PHARMACY BENEFIT:

## 2024-06-13 NOTE — Telephone Encounter (Signed)
 Prolia  VOB initiated via [email or web address]  Next Prolia  inj DUE: 06/19/24

## 2024-06-13 NOTE — Telephone Encounter (Signed)
 SABRA

## 2024-06-14 NOTE — Telephone Encounter (Signed)
 Buy/Bill (Office supplied medication)  Out-of-pocket cost due at time of clinic visit: $377  Number of injection/visits approved: 2  Primary: [employer]-MEDIC[employer] Co-insurance: 20% Admin fee co-insurance: 20%  Secondary: --- Co-insurance:  Admin fee co-insurance:   Medical Benefit Details: Date Benefits were checked: 06/13/24 Deductible: NO/ Coinsurance: 20%/ Admin Fee: 20%  Prior Auth: APPROVED PA# [id number] Expiration Date: 06/13/24-06/13/25  # of doses approved: 2 -----------------------------------------------------------------------  Patient NOT eligible for Copay Card. Copay Card can make patient's cost as little as $25. Link to apply: [email or web address]  ** This summary of benefits is an estimation of the patient's out-of-pocket cost. Exact cost may very based on individual plan coverage.

## 2024-06-15 NOTE — Telephone Encounter (Signed)
 See referral

## 2024-06-17 NOTE — Telephone Encounter (Signed)
 Rx prescribed by gyn. Message addressed.  Call to patient. Patient advised of benefits as seen above for Prolia  injection and patient verbalized understanding. Patient scheduled for Prolia  on 06/27/24 at 0845. Patient agreeable to date and time of appointment.   Copied from CRM #[id number]. Topic: Clinical - Prescription Issue >> Jun 14, 2024  1:16 PM LaVerne A wrote: Reason for CRM: Nena from [facility] called regarding denosumab  (PROLIA ) 60 MG/ML SOSY injection. The prior authorization was approved under part B.  The office would have to buy it and keep it at the office or if you want the patient to get it from the pharmacy it would have to go under part D but there's an authorization number [id number].  Another option is to prescribe one of these 2 are formulary: Bildyos  and Ospomyv.  If you choose to either of these they can be sent to [facility], [phone].  The caller said that Dr. Jiles name was on the prescription but I didn't see any appointments the patient may have had or his name on any of the prescriptions in the patient's chart.  Thanks.

## 2024-06-27 ENCOUNTER — Ambulatory Visit

## 2024-11-02 ENCOUNTER — Encounter: Admitting: Nurse Practitioner
